# Patient Record
Sex: Female | Born: 1955 | Race: White | Hispanic: No | Smoking: Never smoker
Health system: Southern US, Community
[De-identification: ages and names within clinical notes are randomized; demographics above are authoritative.]

## PROBLEM LIST (undated history)

## (undated) DIAGNOSIS — F419 Anxiety disorder, unspecified: Secondary | ICD-10-CM

## (undated) DIAGNOSIS — M2669 Other specified disorders of temporomandibular joint: Secondary | ICD-10-CM

## (undated) DIAGNOSIS — F329 Major depressive disorder, single episode, unspecified: Secondary | ICD-10-CM

## (undated) DIAGNOSIS — M26649 Arthritis of unspecified temporomandibular joint: Secondary | ICD-10-CM

## (undated) DIAGNOSIS — F32A Depression, unspecified: Secondary | ICD-10-CM

## (undated) DIAGNOSIS — K829 Disease of gallbladder, unspecified: Secondary | ICD-10-CM

## (undated) DIAGNOSIS — E039 Hypothyroidism, unspecified: Secondary | ICD-10-CM

## (undated) DIAGNOSIS — R519 Headache, unspecified: Secondary | ICD-10-CM

## (undated) DIAGNOSIS — D693 Immune thrombocytopenic purpura: Secondary | ICD-10-CM

## (undated) DIAGNOSIS — E079 Disorder of thyroid, unspecified: Secondary | ICD-10-CM

## (undated) DIAGNOSIS — G248 Other dystonia: Secondary | ICD-10-CM

## (undated) DIAGNOSIS — Z8669 Personal history of other diseases of the nervous system and sense organs: Secondary | ICD-10-CM

## (undated) DIAGNOSIS — K279 Peptic ulcer, site unspecified, unspecified as acute or chronic, without hemorrhage or perforation: Secondary | ICD-10-CM

## (undated) DIAGNOSIS — M810 Age-related osteoporosis without current pathological fracture: Secondary | ICD-10-CM

## (undated) DIAGNOSIS — R51 Headache: Secondary | ICD-10-CM

## (undated) DIAGNOSIS — E7800 Pure hypercholesterolemia, unspecified: Secondary | ICD-10-CM

## (undated) DIAGNOSIS — S060X9A Concussion with loss of consciousness of unspecified duration, initial encounter: Secondary | ICD-10-CM

## (undated) DIAGNOSIS — G809 Cerebral palsy, unspecified: Secondary | ICD-10-CM

## (undated) HISTORY — DX: Age-related osteoporosis without current pathological fracture: M81.0

## (undated) HISTORY — PX: TMJ ARTHROPLASTY: SHX1066

## (undated) HISTORY — PX: TUBAL LIGATION: SHX77

## (undated) HISTORY — PX: HEMORROIDECTOMY: SUR656

---
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
## 1982-01-25 HISTORY — PX: CARPAL TUNNEL RELEASE: SHX101

## 2005-06-17 ENCOUNTER — Ambulatory Visit: Payer: Self-pay | Admitting: Family Medicine

## 2006-01-25 DIAGNOSIS — K829 Disease of gallbladder, unspecified: Secondary | ICD-10-CM

## 2006-01-25 DIAGNOSIS — K279 Peptic ulcer, site unspecified, unspecified as acute or chronic, without hemorrhage or perforation: Secondary | ICD-10-CM

## 2006-01-25 HISTORY — DX: Peptic ulcer, site unspecified, unspecified as acute or chronic, without hemorrhage or perforation: K27.9

## 2006-01-25 HISTORY — PX: CHOLECYSTECTOMY: SHX55

## 2006-01-25 HISTORY — DX: Disease of gallbladder, unspecified: K82.9

## 2008-03-04 ENCOUNTER — Ambulatory Visit: Payer: Self-pay | Admitting: Family Medicine

## 2008-03-13 ENCOUNTER — Ambulatory Visit: Payer: Self-pay | Admitting: Family Medicine

## 2008-09-10 ENCOUNTER — Ambulatory Visit: Payer: Self-pay | Admitting: Family Medicine

## 2011-09-07 DIAGNOSIS — G8929 Other chronic pain: Secondary | ICD-10-CM | POA: Insufficient documentation

## 2011-12-26 HISTORY — PX: CATARACT EXTRACTION W/ INTRAOCULAR LENS IMPLANT: SHX1309

## 2012-01-17 ENCOUNTER — Ambulatory Visit: Payer: Self-pay | Admitting: Ophthalmology

## 2012-07-11 DIAGNOSIS — E7800 Pure hypercholesterolemia, unspecified: Secondary | ICD-10-CM | POA: Insufficient documentation

## 2012-10-19 DIAGNOSIS — M542 Cervicalgia: Secondary | ICD-10-CM | POA: Insufficient documentation

## 2012-10-19 DIAGNOSIS — Z79899 Other long term (current) drug therapy: Secondary | ICD-10-CM | POA: Insufficient documentation

## 2012-11-09 DIAGNOSIS — Z8669 Personal history of other diseases of the nervous system and sense organs: Secondary | ICD-10-CM | POA: Insufficient documentation

## 2012-11-09 DIAGNOSIS — G248 Other dystonia: Secondary | ICD-10-CM | POA: Insufficient documentation

## 2013-08-10 DIAGNOSIS — E559 Vitamin D deficiency, unspecified: Secondary | ICD-10-CM | POA: Insufficient documentation

## 2014-10-06 ENCOUNTER — Encounter: Payer: Self-pay | Admitting: Emergency Medicine

## 2014-10-06 ENCOUNTER — Ambulatory Visit
Admission: EM | Admit: 2014-10-06 | Discharge: 2014-10-06 | Disposition: A | Discharge status: Home / Self Care | Payer: Federal, State, Local not specified - PPO | Attending: Family Medicine | Admitting: Family Medicine

## 2014-10-06 DIAGNOSIS — J300 Vasomotor rhinitis: Secondary | ICD-10-CM

## 2014-10-06 DIAGNOSIS — R42 Dizziness and giddiness: Secondary | ICD-10-CM

## 2014-10-06 DIAGNOSIS — H6123 Impacted cerumen, bilateral: Secondary | ICD-10-CM | POA: Diagnosis not present

## 2014-10-06 DIAGNOSIS — H6593 Unspecified nonsuppurative otitis media, bilateral: Secondary | ICD-10-CM

## 2014-10-06 HISTORY — DX: Disorder of thyroid, unspecified: E07.9

## 2014-10-06 MED ORDER — SALINE SPRAY 0.65 % NA SOLN: 2.0000 | NASAL | Status: DC

## 2014-10-06 MED ORDER — FLUTICASONE PROPIONATE 50 MCG/ACT NA SUSP: 1.0000 | Freq: Two times a day (BID) | NASAL | Status: DC

## 2014-10-06 MED ORDER — MECLIZINE HCL 25 MG PO TABS: 25.0000 mg | ORAL_TABLET | Freq: Three times a day (TID) | ORAL | Status: DC | PRN

## 2014-10-06 NOTE — ED Notes (Signed)
Patient presents here with c/o lt ear ache associted with dizziness - on and off for about 3 weeks now, states that t all started with the recent air travel, denies any fever

## 2014-10-06 NOTE — Discharge Instructions (Signed)
Cerumen Impaction A cerumen impaction is when the wax in your ear forms a plug. This plug usually causes reduced hearing. Sometimes it also causes an earache or dizziness. Removing a cerumen impaction can be difficult and painful. The wax sticks to the ear canal. The canal is sensitive and bleeds easily. If you try to remove a heavy wax buildup with a cotton tipped swab, you may push it in further. Irrigation with water, suction, and small ear curettes may be used to clear out the wax. If the impaction is fixed to the skin in the ear canal, ear drops may be needed for a few days to loosen the wax. People who build up a lot of wax frequently can use ear wax removal products available in your local drugstore. SEEK MEDICAL CARE IF:  You develop an earache, increased hearing loss, or marked dizziness. Document Released: 02/19/2004 Document Revised: 04/05/2011 Document Reviewed: 04/10/2009 [facility] Patient Information 2015 [place], [place]. This information is not intended to replace advice given to you by your health care provider. Make sure you discuss any questions you have with your health care provider.  Dizziness Dizziness is a common problem. It is a feeling of unsteadiness or light-headedness. You may feel like you are about to faint. Dizziness can lead to injury if you stumble or fall. A person of any age group can suffer from dizziness, but dizziness is more common in older adults. CAUSES  Dizziness can be caused by many different things, including:  Middle ear problems.  Standing for too long.  Infections.  An allergic reaction.  Aging.  An emotional response to something, such as the sight of blood.  Side effects of medicines.  Tiredness.  Problems with circulation or blood pressure.  Excessive use of alcohol or medicines, or illegal drug use.  Breathing too fast (hyperventilation).  An irregular heart rhythm (arrhythmia).  A low red blood cell count  (anemia).  Pregnancy.  Vomiting, diarrhea, fever, or other illnesses that cause body fluid loss (dehydration).  Diseases or conditions such as Parkinson's disease, high blood pressure (hypertension), diabetes, and thyroid problems.  Exposure to extreme heat. DIAGNOSIS  Your health care provider will ask about your symptoms, perform a physical exam, and perform an electrocardiogram (ECG) to record the electrical activity of your heart. Your health care provider may also perform other heart or blood tests to determine the cause of your dizziness. These may include:  Transthoracic echocardiogram (TTE). During echocardiography, sound waves are used to evaluate how blood flows through your heart.  Transesophageal echocardiogram (TEE).  Cardiac monitoring. This allows your health care provider to monitor your heart rate and rhythm in real time.  Holter monitor. This is a portable device that records your heartbeat and can help diagnose heart arrhythmias. It allows your health care provider to track your heart activity for several days if needed.  Stress tests by exercise or by giving medicine that makes the heart beat faster. TREATMENT  Treatment of dizziness depends on the cause of your symptoms and can vary greatly. HOME CARE INSTRUCTIONS   Drink enough fluids to keep your urine clear or pale yellow. This is especially important in very hot weather. In older adults, it is also important in cold weather.  Take your medicine exactly as directed if your dizziness is caused by medicines. When taking blood pressure medicines, it is especially important to get up slowly.  Rise slowly from chairs and steady yourself until you feel okay.  In the morning, first sit  up on the side of the bed. When you feel okay, stand slowly while holding onto something until you know your balance is fine.  Move your legs often if you need to stand in one place for a long time. Tighten and relax your muscles in  your legs while standing.  Have someone stay with you for 1-2 days if dizziness continues to be a problem. Do this until you feel you are well enough to stay alone. Have the person call your health care provider if he or she notices changes in you that are concerning.  Do not drive or use heavy machinery if you feel dizzy.  Do not drink alcohol. SEEK IMMEDIATE MEDICAL CARE IF:   Your dizziness or light-headedness gets worse.  You feel nauseous or vomit.  You have problems talking, walking, or using your arms, hands, or legs.  You feel weak.  You are not thinking clearly or you have trouble forming sentences. It may take a friend or family member to notice this.  You have chest pain, abdominal pain, shortness of breath, or sweating.  Your vision changes.  You notice any bleeding.  You have side effects from medicine that seems to be getting worse rather than better. MAKE SURE YOU:   Understand these instructions.  Will watch your condition.  Will get help right away if you are not doing well or get worse. Document Released: 07/07/2000 Document Revised: 01/16/2013 Document Reviewed: 07/31/2010 [facility] Patient Information 2015 [place], [place]. This information is not intended to replace advice given to you by your health care provider. Make sure you discuss any questions you have with your health care provider. Otitis Media With Effusion Otitis media with effusion is the presence of fluid in the middle ear. This is a common problem in children, which often follows ear infections. It may be present for weeks or longer after the infection. Unlike an acute ear infection, otitis media with effusion refers only to fluid behind the ear drum and not infection. Children with repeated ear and sinus infections and allergy problems are the most likely to get otitis media with effusion. CAUSES  The most frequent cause of the fluid buildup is dysfunction of the eustachian tubes. These are the  tubes that drain fluid in the ears to the back of the nose (nasopharynx). SYMPTOMS   The main symptom of this condition is hearing loss. As a result, you or your child may:  Listen to the TV at a loud volume.  Not respond to questions.  Ask "what" often when spoken to.  Mistake or confuse one sound or word for another.  There may be a sensation of fullness or pressure but usually not pain. DIAGNOSIS   Your health care provider will diagnose this condition by examining you or your child's ears.  Your health care provider may test the pressure in you or your child's ear with a tympanometer.  A hearing test may be conducted if the problem persists. TREATMENT   Treatment depends on the duration and the effects of the effusion.  Antibiotics, decongestants, nose drops, and cortisone-type drugs (tablets or nasal spray) may not be helpful.  Children with persistent ear effusions may have delayed language or behavioral problems. Children at risk for developmental delays in hearing, learning, and speech may require referral to a specialist earlier than children not at risk.  You or your child's health care provider may suggest a referral to an ear, nose, and throat surgeon for treatment. The following may  help restore normal hearing:  Drainage of fluid.  Placement of ear tubes (tympanostomy tubes).  Removal of adenoids (adenoidectomy). HOME CARE INSTRUCTIONS   Avoid secondhand smoke.  Infants who are breastfed are less likely to have this condition.  Avoid feeding infants while they are lying flat.  Avoid known environmental allergens.  Avoid people who are sick. SEEK MEDICAL CARE IF:   Hearing is not better in 3 months.  Hearing is worse.  Ear pain.  Drainage from the ear.  Dizziness. MAKE SURE YOU:   Understand these instructions.  Will watch your condition.  Will get help right away if you are not doing well or get worse. Document Released: 02/19/2004  Document Revised: 05/28/2013 Document Reviewed: 08/08/2012 [facility] Patient Information 2015 [place], [place]. This information is not intended to replace advice given to you by your health care provider. Make sure you discuss any questions you have with your health care provider. Allergic Rhinitis Allergic rhinitis is when the mucous membranes in the nose respond to allergens. Allergens are particles in the air that cause your body to have an allergic reaction. This causes you to release allergic antibodies. Through a chain of events, these eventually cause you to release histamine into the blood stream. Although meant to protect the body, it is this release of histamine that causes your discomfort, such as frequent sneezing, congestion, and an itchy, runny nose.  CAUSES  Seasonal allergic rhinitis (hay fever) is caused by pollen allergens that may come from grasses, trees, and weeds. Year-round allergic rhinitis (perennial allergic rhinitis) is caused by allergens such as house dust mites, pet dander, and mold spores.  SYMPTOMS   Nasal stuffiness (congestion).  Itchy, runny nose with sneezing and tearing of the eyes. DIAGNOSIS  Your health care provider can help you determine the allergen or allergens that trigger your symptoms. If you and your health care provider are unable to determine the allergen, skin or blood testing may be used. TREATMENT  Allergic rhinitis does not have a cure, but it can be controlled by:  Medicines and allergy shots (immunotherapy).  Avoiding the allergen. Hay fever may often be treated with antihistamines in pill or nasal spray forms. Antihistamines block the effects of histamine. There are over-the-counter medicines that may help with nasal congestion and swelling around the eyes. Check with your health care provider before taking or giving this medicine.  If avoiding the allergen or the medicine prescribed do not work, there are many new medicines your health care  provider can prescribe. Stronger medicine may be used if initial measures are ineffective. Desensitizing injections can be used if medicine and avoidance does not work. Desensitization is when a patient is given ongoing shots until the body becomes less sensitive to the allergen. Make sure you follow up with your health care provider if problems continue. HOME CARE INSTRUCTIONS It is not possible to completely avoid allergens, but you can reduce your symptoms by taking steps to limit your exposure to them. It helps to know exactly what you are allergic to so that you can avoid your specific triggers. SEEK MEDICAL CARE IF:   You have a fever.  You develop a cough that does not stop easily (persistent).  You have shortness of breath.  You start wheezing.  Symptoms interfere with normal daily activities. Document Released: 10/06/2000 Document Revised: 01/16/2013 Document Reviewed: 09/18/2012 [facility] Patient Information 2015 [place], [place]. This information is not intended to replace advice given to you by your health care provider. Make sure  you discuss any questions you have with your health care provider.

## 2014-10-06 NOTE — ED Provider Notes (Signed)
CSN: [id number]     Arrival date & time 10/06/14  1330 History   First MD Initiated Contact with Patient 10/06/14 1413     Chief Complaint  Patient presents with  . Otalgia  . Dizziness   (Consider location/radiation/quality/duration/timing/severity/associated sxs/prior Treatment) HPI Comments: Single caucasian female here for evaluation of left ear pain and dizziness after travel to [place] via airplane; ear pain and dizziness intermittent x 3 weeks.  Denied ear discharge.  Feels like she has water in her ears.  Tried peroxide without any relief in external canal.  Psychiatric RN doesn't require work note  The history is provided by the patient.    Past Medical History  Diagnosis Date  . Thyroid disease    Past Surgical History  Procedure Laterality Date  . Tmj arthroplasty     History reviewed. No pertinent family history. Social History  Substance Use Topics  . Smoking status: Never Smoker   . Smokeless tobacco: None  . Alcohol Use: No   OB History    No data available     Review of Systems  Constitutional: Negative for fever, chills, diaphoresis, activity change, appetite change, fatigue and unexpected weight change.  HENT: Positive for ear pain. Negative for congestion, dental problem, drooling, ear discharge, facial swelling, hearing loss, mouth sores, nosebleeds, postnasal drip, rhinorrhea, sinus pressure, sneezing, sore throat, tinnitus, trouble swallowing and voice change.   Respiratory: Negative for cough, choking, shortness of breath, wheezing and stridor.   Cardiovascular: Negative for chest pain, palpitations and leg swelling.  Gastrointestinal: Negative for nausea, vomiting, abdominal pain, diarrhea, constipation, abdominal distention and rectal pain.  Genitourinary: Negative for dysuria.  Musculoskeletal: Negative for myalgias, back pain, joint swelling, arthralgias, gait problem, neck pain and neck stiffness.  Skin: Negative for color change, pallor, rash  and wound.  Allergic/Immunologic: Positive for environmental allergies. Negative for food allergies.  Neurological: Positive for dizziness. Negative for tremors, seizures, syncope, facial asymmetry, speech difficulty, weakness, light-headedness, numbness and headaches.  Hematological: Negative for adenopathy. Does not bruise/bleed easily.  Psychiatric/Behavioral: Negative for behavioral problems, confusion, sleep disturbance and agitation.    Allergies  Codeine  Home Medications   Prior to Admission medications   Medication Sig Start Date End Date Taking? Authorizing Provider  levothyroxine (SYNTHROID, LEVOTHROID) 125 MCG tablet Take 125 mcg by mouth daily before breakfast.   Yes Historical Provider, MD  sertraline (ZOLOFT) 100 MG tablet Take 150 mg by mouth daily.   Yes Historical Provider, MD  traMADol (ULTRAM) 50 MG tablet Take by mouth every 6 (six) hours as needed.   Yes Historical Provider, MD  fluticasone (FLONASE) 50 MCG/ACT nasal spray Place 1 spray into both nostrils 2 (two) times daily. 10/06/14   Barbaraann Barthel, NP  meclizine (ANTIVERT) 25 MG tablet Take 1 tablet (25 mg total) by mouth 3 (three) times daily as needed for dizziness (max 100mg  per 24 hours). 10/06/14   Barbaraann Barthel, NP  sodium chloride (OCEAN) 0.65 % SOLN nasal spray Place 2 sprays into both nostrils every 2 (two) hours while awake. 10/06/14   Barbaraann Barthel, NP   Meds Ordered and Administered this Visit  Medications - No data to display  BP 158/59 mmHg  Pulse 76  Temp(Src) 97.7 F (36.5 C) (Tympanic)  Resp 18  Ht 5\' 3"  (1.6 m)  Wt 145 lb (65.772 kg)  BMI 25.69 kg/m2  SpO2 98% No data found.   Physical Exam  Constitutional: She is oriented to person, place, and time.  Vital signs are normal. She appears well-developed and well-nourished. No distress.  HENT:  Head: Normocephalic and atraumatic.  Right Ear: Hearing, external ear and ear canal normal. A middle ear effusion is present.  Left Ear:  Hearing, external ear and ear canal normal. A middle ear effusion is present.  Nose: Nose normal. No mucosal edema, rhinorrhea, nose lacerations, sinus tenderness, nasal deformity, septal deviation or nasal septal hematoma. No epistaxis.  No foreign bodies. Right sinus exhibits no maxillary sinus tenderness and no frontal sinus tenderness. Left sinus exhibits no maxillary sinus tenderness and no frontal sinus tenderness.  Mouth/Throat: Uvula is midline and mucous membranes are normal. Mucous membranes are not pale and not dry. She does not have dentures. No oral lesions. No trismus in the jaw. Normal dentition. No dental abscesses, uvula swelling, lacerations or dental caries. Posterior oropharyngeal edema and posterior oropharyngeal erythema present. No oropharyngeal exudate or tonsillar abscesses.  Cobblestoning posterior pharynx; edema/erythema bilateral turbinates with clear discharge; bilateral TMs with air fluid level slight opacity; cerumen soft yellow bilateral external canals unable to initially visualize TMs curretage use to remove cerumen incomplete left; saline irrigation by RN cleared all cerumen slight erythema canal drop of blood where cerumen had been adherent patient reported hearing better, some relief ache but not sore  Eyes: Conjunctivae, EOM and lids are normal. Pupils are equal, round, and reactive to light. Right eye exhibits no discharge. Left eye exhibits no discharge. No scleral icterus.  Neck: Trachea normal and normal range of motion. Neck supple. No tracheal deviation present. No thyromegaly present.  Cardiovascular: Normal rate, regular rhythm, normal heart sounds and intact distal pulses.   Pulmonary/Chest: Effort normal and breath sounds normal. No stridor. No respiratory distress. She has no wheezes. She has no rales. She exhibits no tenderness.  Abdominal: Soft. She exhibits no distension.  Musculoskeletal: Normal range of motion. She exhibits no edema or tenderness.   Lymphadenopathy:    She has no cervical adenopathy.  Neurological: She is alert and oriented to person, place, and time. She exhibits normal muscle tone. Coordination normal.  Skin: Skin is warm, dry and intact. No rash noted. She is not diaphoretic. No erythema. No pallor.  Psychiatric: She has a normal mood and affect. Her speech is normal and behavior is normal. Judgment and thought content normal. Cognition and memory are normal.  Nursing note and vitals reviewed.   ED Course  Procedures (including critical care time)  Labs Review Labs Reviewed - No data to display  Imaging Review No results found.  1400 attempted cerumen removal bilateral external canals with currettage.  Partial clearing left and complete clearing right.  Ordered ear irrigation by RN for left ear. Patient refused trial of meclizine in clinic will pick up at pharmacy and try at home and start flonase and nasal saline.   Patient verbalized understanding of information/instructions, agreed with plan of care.  1445 re-evaluation left external canal after saline irrigation by RN Kreg Shropshire.  Complete removal of cerumen.  Obtained hard yellow wax from left ear with irrigation.  Patient reported improved hearing at conclusion of procedure.  Spot blood in external canal where cerumen was adhered, slight discomfort and erythema noted external left ear canal.  Air fluid level noted slight opacity left TM.  MDM   1. Otitis media with effusion, bilateral   2. Cerumen impaction, bilateral   3. Dizziness   4. Vasomotor rhinitis    Supportive treatment.   No evidence of invasive bacterial infection, non toxic  and well hydrated.  This is most likely self limiting viral infection.  I do not see where any further testing or imaging is necessary at this time.   I will suggest supportive care, rest, good hygiene and encourage the patient to take adequate fluids.  The patient is to return to clinic or EMERGENCY ROOM if  symptoms worsen or change significantly e.g. ear pain, fever, purulent discharge from ears or bleeding.  Exitcare handout on otitis media with effusion given to patient.  Patient verbalized agreement and understanding of treatment plan.    Curettage performed by provider bilaterally complete clearing right and incomplete clearing left cerumen; requested cerumen irrigation by RN Kreg Shropshire and complete clearing of earwax obtained by nurse for left external canal utilizing syringe method.  Patient reported slight discomfort external ear canal after procedure, minor redness noted bilaterally where wax had been adherent and TMs intact without erythema.  Patient reported sounds are louder now.  Discussed purpose of earwax with patient.  Avoid cotton applicator (Q-tip) use in ears.  Patient verbalized understanding, agreed with plan of care and had no further questions at this time.   Patient may use normal saline nasal spray as needed.  Start flonase 1 spray each nostril BID.  Consider antihistamine.  Meclizine Rx in case dizziness continues resolved in clinic after cerumen removal.  Discussed with patient fluid in ears can sometimes cause dizziness.   Avoid triggers if possible.  Shower prior to bedtime if exposed to triggers.  If allergic dust/dust mites recommend mattress/pillow covers/encasements; washing linens, vacuuming, sweeping, dusting weekly.  Call or return to clinic as needed if these symptoms worsen or fail to improve as anticipated.   Exitcare handout on allergic rhinitis given to patient.  Patient verbalized understanding of instructions, agreed with plan of care and had no further questions at this time.  P2:  Avoidance and hand washing.       Barbaraann Barthel, NP 10/07/14 2034

## 2014-12-10 DIAGNOSIS — N952 Postmenopausal atrophic vaginitis: Secondary | ICD-10-CM | POA: Insufficient documentation

## 2015-07-02 DIAGNOSIS — G248 Other dystonia: Secondary | ICD-10-CM | POA: Insufficient documentation

## 2015-11-24 ENCOUNTER — Other Ambulatory Visit: Payer: Self-pay | Admitting: Medical Oncology

## 2015-11-24 DIAGNOSIS — R1030 Lower abdominal pain, unspecified: Secondary | ICD-10-CM

## 2015-11-25 ENCOUNTER — Other Ambulatory Visit: Payer: Self-pay | Admitting: Medical Oncology

## 2015-11-25 DIAGNOSIS — R102 Pelvic and perineal pain: Secondary | ICD-10-CM

## 2015-12-01 ENCOUNTER — Ambulatory Visit
Admission: RE | Admit: 2015-12-01 | Discharge: 2015-12-01 | Disposition: A | Discharge status: Home / Self Care | Payer: Federal, State, Local not specified - PPO | Source: Ambulatory Visit | Attending: Medical Oncology | Admitting: Medical Oncology

## 2015-12-01 DIAGNOSIS — R109 Unspecified abdominal pain: Secondary | ICD-10-CM | POA: Diagnosis not present

## 2015-12-01 DIAGNOSIS — R1030 Lower abdominal pain, unspecified: Secondary | ICD-10-CM

## 2015-12-01 DIAGNOSIS — R102 Pelvic and perineal pain: Secondary | ICD-10-CM

## 2016-04-21 DIAGNOSIS — G44229 Chronic tension-type headache, not intractable: Secondary | ICD-10-CM | POA: Insufficient documentation

## 2016-04-21 DIAGNOSIS — Z5181 Encounter for therapeutic drug level monitoring: Secondary | ICD-10-CM | POA: Insufficient documentation

## 2016-04-21 DIAGNOSIS — Z79891 Long term (current) use of opiate analgesic: Secondary | ICD-10-CM | POA: Insufficient documentation

## 2016-06-04 ENCOUNTER — Ambulatory Visit
Admission: EM | Admit: 2016-06-04 | Discharge: 2016-06-04 | Disposition: A | Discharge status: Home / Self Care | Payer: Federal, State, Local not specified - PPO | Attending: Family Medicine | Admitting: Family Medicine

## 2016-06-04 DIAGNOSIS — J069 Acute upper respiratory infection, unspecified: Secondary | ICD-10-CM

## 2016-06-04 DIAGNOSIS — J9801 Acute bronchospasm: Secondary | ICD-10-CM

## 2016-06-04 MED ORDER — BENZONATATE 100 MG PO CAPS: 100.0000 mg | ORAL_CAPSULE | Freq: Three times a day (TID) | ORAL | 0 refills | Status: DC | PRN

## 2016-06-04 MED ORDER — DOXYCYCLINE HYCLATE 100 MG PO CAPS: 100.0000 mg | ORAL_CAPSULE | Freq: Two times a day (BID) | ORAL | 0 refills | Status: DC

## 2016-06-04 MED ORDER — PREDNISONE 20 MG PO TABS: 40.0000 mg | ORAL_TABLET | Freq: Every day | ORAL | 0 refills | Status: AC

## 2016-06-04 MED ORDER — OPTICHAMBER DIAMOND MISC
1.0000 | Freq: Once | Status: AC
  Administered 2016-06-04: 1

## 2016-06-04 MED ORDER — ALBUTEROL SULFATE HFA 108 (90 BASE) MCG/ACT IN AERS: 2.0000 | INHALATION_SPRAY | RESPIRATORY_TRACT | 0 refills | Status: DC | PRN

## 2016-06-04 NOTE — ED Triage Notes (Signed)
Patient complains of cough, sore throat, hoarseness. Patient states that she has a daughter graduating from college this weekend and has family members in town that immunocompromised. Patient states that she wants to make sure she doesn't pass this on to anyone else.

## 2016-06-04 NOTE — ED Provider Notes (Signed)
[facility] ____________________________________________  Time seen: Approximately 2:37 PM  I have reviewed the triage vital signs and the nursing notes.   HISTORY  Chief Complaint Cough   HPI Helen Nguyen is a 61 y.o. female presenting for evaluation of 6 days of cough and congestion. Patient states that she feels that she has coughing fits with associated wheezing. Denies any persistent wheezing. Denies chest pain or shortness of breath. States some sore throat over the last few days. Reports some nasal congestion, denies sinus pain or sinus pressure. Reports does cough up greenish phlegm. Reports her daughter visiting her and had similar just prior to her symptom onset. Denies any fevers. Reports continues to eat and drink well. Reports continues to remain active. Patient states that she has a history of similar presentation with bronchitis. Reports symptoms unresolved with multiple over-the-counter cough and congestion medications and vitamin C.  Denies chest pain, shortness of breath, abdominal pain, dysuria, extremity pain, extremity swelling or rash. Denies recent sickness. Denies recent antibiotic use.    Past Medical History:  Diagnosis Date  . Thyroid disease     There are no active problems to display for this patient.   Past Surgical History:  Procedure Laterality Date  . TMJ ARTHROPLASTY       No current facility-administered medications for this encounter.   Current Outpatient Prescriptions:  .  levothyroxine (SYNTHROID, LEVOTHROID) 125 MCG tablet, Take 125 mcg by mouth daily before breakfast., Disp: , Rfl:  .  sertraline (ZOLOFT) 100 MG tablet, Take 150 mg by mouth daily., Disp: , Rfl:  .  traMADol (ULTRAM) 50 MG tablet, Take by mouth every 6 (six) hours as needed., Disp: , Rfl:  .  albuterol (PROVENTIL HFA;VENTOLIN HFA) 108 (90 Base) MCG/ACT inhaler, Inhale 2 puffs into the lungs every 4 (four) hours as needed for wheezing., Disp: 1 Inhaler,  Rfl: 0 .  benzonatate (TESSALON PERLES) 100 MG capsule, Take 1 capsule (100 mg total) by mouth 3 (three) times daily as needed for cough., Disp: 15 capsule, Rfl: 0 .  doxycycline (VIBRAMYCIN) 100 MG capsule, Take 1 capsule (100 mg total) by mouth 2 (two) times daily., Disp: 20 capsule, Rfl: 0 .  fluticasone (FLONASE) 50 MCG/ACT nasal spray, Place 1 spray into both nostrils 2 (two) times daily., Disp: 16 g, Rfl: 0 .  meclizine (ANTIVERT) 25 MG tablet, Take 1 tablet (25 mg total) by mouth 3 (three) times daily as needed for dizziness (max 100mg  per 24 hours)., Disp: 30 tablet, Rfl: 0 .  predniSONE (DELTASONE) 20 MG tablet, Take 2 tablets (40 mg total) by mouth daily., Disp: 10 tablet, Rfl: 0 .  sodium chloride (OCEAN) 0.65 % SOLN nasal spray, Place 2 sprays into both nostrils every 2 (two) hours while awake., Disp: , Rfl: 0  Allergies Codeine  History reviewed. No pertinent family history.  Social History Social History  Substance Use Topics  . Smoking status: Never Smoker  . Smokeless tobacco: Never Used  . Alcohol use Yes     Comment: occasionally    Review of Systems Constitutional: No fever/chills ENT: Positive sore throat. Cardiovascular: Denies chest pain. Respiratory: Denies shortness of breath. Gastrointestinal: No abdominal pain.  No nausea, no vomiting.  No diarrhea.  No constipation. Genitourinary: Negative for dysuria. Musculoskeletal: Negative for back pain. Skin: Negative for rash.   ____________________________________________   PHYSICAL EXAM:  VITAL SIGNS: ED Triage Vitals  Enc Vitals Group     BP 06/04/16 1412 (!) 150/84  Pulse Rate 06/04/16 1412 77     Resp 06/04/16 1412 16     Temp 06/04/16 1412 98.9 F (37.2 C)     Temp Source 06/04/16 1412 Oral     SpO2 06/04/16 1412 96 %     Weight 06/04/16 1410 157 lb (71.2 kg)     Height 06/04/16 1410 5\' 3"  (1.6 m)     Head Circumference --      Peak Flow --      Pain Score 06/04/16 1410 5     Pain Loc --       Pain Edu? --      Excl. in GC? --     Constitutional: Alert and oriented. Well appearing and in no acute distress. Eyes: Conjunctivae are normal. PERRL. EOMI. Head: Atraumatic. No sinus tenderness to palpation. No swelling. No erythema.  Ears: no erythema, normal TMs bilaterally.   Nose:Nasal congestion with clear rhinorrhea  Mouth/Throat: Mucous membranes are moist. Mild pharyngeal erythema. No tonsillar swelling or exudate.  Neck: No stridor.  No cervical spine tenderness to palpation. Hematological/Lymphatic/Immunilogical: No cervical lymphadenopathy. Cardiovascular: Normal rate, regular rhythm. Grossly normal heart sounds.  Good peripheral circulation. Respiratory: Normal respiratory effort.  No retractions. No wheezes, rales or rhonchi. Good air movement. Dry intermittent cough noted in room with bronchospasm. Speaks in complete sentences. Musculoskeletal: Ambulatory with steady gait. No cervical, thoracic or lumbar tenderness to palpation. Neurologic:  Normal speech and language. No gait instability. Skin:  Skin appears warm, dry Psychiatric: Mood and affect are normal. Speech and behavior are normal. ___________________________________________   LABS (all labs ordered are listed, but only abnormal results are displayed)  Labs Reviewed - No data to display  PROCEDURES Procedures    INITIAL IMPRESSION / ASSESSMENT AND PLAN / ED COURSE  Pertinent labs & imaging results that were available during my care of the patient were reviewed by me and considered in my medical decision making (see chart for details).  Well-appearing patient. No acute distress. Suspect viral upper respiratory infection with associated bronchospasm. Patient declines x-ray at this time. Patient does report mild sore throat, but states mostly from coughing, discussed with patient evaluation of strep swab, patient declined strep swab at this time. Discussed the patient suspect viral and will treat patient  supportively with oral prednisone, when necessary albuterol inhaler and Tessalon Perles. Discussed with patient suspect viral, patient agreed. Also discussed with patient the symptom persists, Rx given for oral doxycycline to initiate another 3 days if symptoms not improving. Discussed strict follow-up and return parameters for any worsening concerns or change in status.Discussed indication, risks and benefits of medications with patient.  Discussed follow up with Primary care physician this week. Discussed follow up and return parameters including no resolution or any worsening concerns. Patient verbalized understanding and agreed to plan.   ____________________________________________   FINAL CLINICAL IMPRESSION(S) / ED DIAGNOSES  Final diagnoses:  URI with cough and congestion  Bronchospasm     Discharge Medication List as of 06/04/2016  2:50 PM    START taking these medications   Details  albuterol (PROVENTIL HFA;VENTOLIN HFA) 108 (90 Base) MCG/ACT inhaler Inhale 2 puffs into the lungs every 4 (four) hours as needed for wheezing., Starting Fri 06/04/2016, Normal    benzonatate (TESSALON PERLES) 100 MG capsule Take 1 capsule (100 mg total) by mouth 3 (three) times daily as needed for cough., Starting Fri 06/04/2016, Normal    doxycycline (VIBRAMYCIN) 100 MG capsule Take 1 capsule (100 mg total) by mouth  2 (two) times daily., Starting Fri 06/04/2016, Print    predniSONE (DELTASONE) 20 MG tablet Take 2 tablets (40 mg total) by mouth daily., Starting Fri 06/04/2016, Normal        Note: This dictation was prepared with Dragon dictation along with smaller phrase technology. Any transcriptional errors that result from this process are unintentional.           Renford DillsMiller, Chrishauna Mee, NP 06/04/16 1630

## 2016-06-04 NOTE — Discharge Instructions (Signed)
Take medication as prescribed. Rest. Drink plenty of fluids.  ° °Follow up with your primary care physician this week as needed. Return to Urgent care for new or worsening concerns.  ° °

## 2017-01-05 DIAGNOSIS — G809 Cerebral palsy, unspecified: Secondary | ICD-10-CM | POA: Insufficient documentation

## 2017-01-12 DIAGNOSIS — M65311 Trigger thumb, right thumb: Secondary | ICD-10-CM | POA: Insufficient documentation

## 2017-04-18 ENCOUNTER — Ambulatory Visit
Admission: RE | Admit: 2017-04-18 | Discharge: 2017-04-18 | Disposition: A | Discharge status: Home / Self Care | Payer: Federal, State, Local not specified - PPO | Source: Ambulatory Visit | Attending: Family Medicine | Admitting: Family Medicine

## 2017-04-18 ENCOUNTER — Other Ambulatory Visit: Payer: Self-pay | Admitting: Family Medicine

## 2017-04-18 DIAGNOSIS — M5442 Lumbago with sciatica, left side: Secondary | ICD-10-CM | POA: Insufficient documentation

## 2017-04-18 DIAGNOSIS — M5136 Other intervertebral disc degeneration, lumbar region: Secondary | ICD-10-CM | POA: Insufficient documentation

## 2017-04-22 ENCOUNTER — Other Ambulatory Visit: Payer: Self-pay | Admitting: Orthopedic Surgery

## 2017-04-22 DIAGNOSIS — S32020D Wedge compression fracture of second lumbar vertebra, subsequent encounter for fracture with routine healing: Secondary | ICD-10-CM

## 2017-04-23 ENCOUNTER — Ambulatory Visit
Admission: RE | Admit: 2017-04-23 | Discharge: 2017-04-23 | Disposition: A | Discharge status: Home / Self Care | Payer: Federal, State, Local not specified - PPO | Source: Ambulatory Visit | Attending: Orthopedic Surgery | Admitting: Orthopedic Surgery

## 2017-04-23 DIAGNOSIS — R29890 Loss of height: Secondary | ICD-10-CM | POA: Insufficient documentation

## 2017-04-23 DIAGNOSIS — M5137 Other intervertebral disc degeneration, lumbosacral region: Secondary | ICD-10-CM | POA: Insufficient documentation

## 2017-04-23 DIAGNOSIS — M5136 Other intervertebral disc degeneration, lumbar region: Secondary | ICD-10-CM | POA: Diagnosis not present

## 2017-04-23 DIAGNOSIS — M48061 Spinal stenosis, lumbar region without neurogenic claudication: Secondary | ICD-10-CM | POA: Diagnosis not present

## 2017-04-23 DIAGNOSIS — S32020D Wedge compression fracture of second lumbar vertebra, subsequent encounter for fracture with routine healing: Secondary | ICD-10-CM | POA: Insufficient documentation

## 2017-04-23 DIAGNOSIS — X58XXXD Exposure to other specified factors, subsequent encounter: Secondary | ICD-10-CM | POA: Insufficient documentation

## 2017-04-26 ENCOUNTER — Inpatient Hospital Stay
Admission: RE | Admit: 2017-04-26 | Discharge: 2017-04-26 | Disposition: A | Discharge status: Home / Self Care | Payer: Federal, State, Local not specified - PPO | Source: Ambulatory Visit

## 2017-04-26 NOTE — Pre-Procedure Instructions (Signed)
[facility] PATIENT TO BE PUT ON SCHEDULE AS P.A.T VISIT.

## 2017-04-27 ENCOUNTER — Encounter
Admission: RE | Admit: 2017-04-27 | Discharge: 2017-04-27 | Disposition: A | Discharge status: Home / Self Care | Payer: Federal, State, Local not specified - PPO | Source: Ambulatory Visit | Attending: Orthopedic Surgery | Admitting: Orthopedic Surgery

## 2017-04-27 ENCOUNTER — Other Ambulatory Visit: Payer: Self-pay

## 2017-04-27 DIAGNOSIS — Z7952 Long term (current) use of systemic steroids: Secondary | ICD-10-CM | POA: Diagnosis not present

## 2017-04-27 DIAGNOSIS — G809 Cerebral palsy, unspecified: Secondary | ICD-10-CM | POA: Diagnosis not present

## 2017-04-27 DIAGNOSIS — M8008XA Age-related osteoporosis with current pathological fracture, vertebra(e), initial encounter for fracture: Secondary | ICD-10-CM | POA: Diagnosis not present

## 2017-04-27 DIAGNOSIS — F329 Major depressive disorder, single episode, unspecified: Secondary | ICD-10-CM | POA: Diagnosis not present

## 2017-04-27 DIAGNOSIS — F419 Anxiety disorder, unspecified: Secondary | ICD-10-CM | POA: Diagnosis not present

## 2017-04-27 DIAGNOSIS — Z791 Long term (current) use of non-steroidal anti-inflammatories (NSAID): Secondary | ICD-10-CM | POA: Diagnosis not present

## 2017-04-27 DIAGNOSIS — Z79899 Other long term (current) drug therapy: Secondary | ICD-10-CM | POA: Diagnosis not present

## 2017-04-27 DIAGNOSIS — Z79890 Hormone replacement therapy (postmenopausal): Secondary | ICD-10-CM | POA: Diagnosis not present

## 2017-04-27 DIAGNOSIS — E039 Hypothyroidism, unspecified: Secondary | ICD-10-CM | POA: Diagnosis not present

## 2017-04-27 HISTORY — DX: Cerebral palsy, unspecified: G80.9

## 2017-04-27 HISTORY — DX: Arthritis of unspecified temporomandibular joint: M26.649

## 2017-04-27 HISTORY — DX: Anxiety disorder, unspecified: F41.9

## 2017-04-27 HISTORY — DX: Headache: R51

## 2017-04-27 HISTORY — DX: Concussion with loss of consciousness of unspecified duration, initial encounter: S06.0X9A

## 2017-04-27 HISTORY — DX: Peptic ulcer, site unspecified, unspecified as acute or chronic, without hemorrhage or perforation: K27.9

## 2017-04-27 HISTORY — DX: Major depressive disorder, single episode, unspecified: F32.9

## 2017-04-27 HISTORY — DX: Other specified disorders of temporomandibular joint: M26.69

## 2017-04-27 HISTORY — DX: Hypothyroidism, unspecified: E03.9

## 2017-04-27 HISTORY — DX: Immune thrombocytopenic purpura: D69.3

## 2017-04-27 HISTORY — DX: Pure hypercholesterolemia, unspecified: E78.00

## 2017-04-27 HISTORY — DX: Headache, unspecified: R51.9

## 2017-04-27 HISTORY — DX: Personal history of other diseases of the nervous system and sense organs: Z86.69

## 2017-04-27 HISTORY — DX: Other dystonia: G24.8

## 2017-04-27 HISTORY — DX: Depression, unspecified: F32.A

## 2017-04-27 HISTORY — DX: Disease of gallbladder, unspecified: K82.9

## 2017-04-27 LAB — CBC
HEMATOCRIT: 40 % (ref 35.0–47.0)
Hemoglobin: 13.4 g/dL (ref 12.0–16.0)
MCH: 28.7 pg (ref 26.0–34.0)
MCHC: 33.4 g/dL (ref 32.0–36.0)
MCV: 85.8 fL (ref 80.0–100.0)
Platelets: 209 10*3/uL (ref 150–440)
RBC: 4.67 MIL/uL (ref 3.80–5.20)
RDW: 13.6 % (ref 11.5–14.5)
WBC: 9.8 10*3/uL (ref 3.6–11.0)

## 2017-04-27 LAB — BASIC METABOLIC PANEL
Anion gap: 7 (ref 5–15)
BUN: 18 mg/dL (ref 6–20)
CALCIUM: 9.6 mg/dL (ref 8.9–10.3)
CO2: 27 mmol/L (ref 22–32)
Chloride: 105 mmol/L (ref 101–111)
Creatinine, Ser: 0.74 mg/dL (ref 0.44–1.00)
GFR calc non Af Amer: >60 mL/min (ref >60)
Glucose, Bld: 107 mg/dL — ABNORMAL HIGH (ref 65–99)
Potassium: 4 mmol/L (ref 3.5–5.1)
SODIUM: 139 mmol/L (ref 135–145)

## 2017-04-27 LAB — SURGICAL PCR SCREEN
MRSA, PCR: NEGATIVE
STAPHYLOCOCCUS AUREUS: NEGATIVE

## 2017-04-27 MED ORDER — CEFAZOLIN SODIUM-DEXTROSE 2-4 GM/100ML-% IV SOLN
2.0000 g | Freq: Once | INTRAVENOUS | Status: AC
  Administered 2017-04-28: 2 g via INTRAVENOUS

## 2017-04-27 NOTE — Patient Instructions (Signed)
Your procedure is scheduled on:  Thursday, April 28, 2017 Report to Day Surgery on the 2nd floor of [facility] at 7:00 am To find out your arrival time, please call [phone] between 1PM - 3PM on:  REMEMBER: Instructions that are not followed completely may result in serious medical risk, up to and including death; or upon the discretion of your surgeon and anesthesiologist your surgery may need to be rescheduled.  Do not eat food after midnight the night before your procedure.  No gum chewing, lozengers or hard candies.  You may however, drink CLEAR liquids up to 2 hours before you are scheduled to arrive for your surgery. Do not drink anything within 2 hours of the start of your surgery.  Clear liquids include: - water  - apple juice without pulp - clear gatorade - black coffee or tea (Do NOT add anything to the coffee or tea) Do NOT drink anything that is not on this list.  No Alcohol for 24 hours before or after surgery.  No Smoking including e-cigarettes for 24 hours prior to surgery.  No chewable tobacco products for at least 6 hours prior to surgery.  No nicotine patches on the day of surgery.  On the morning of surgery brush your teeth with toothpaste and water, you may rinse your mouth with mouthwash if you wish. Do not swallow any toothpaste or mouthwash.  Notify your doctor if there is any change in your medical condition (cold, fever, infection).  Do not wear jewelry, make-up, hairpins, clips or nail polish.  Do not wear lotions, powders, or perfumes. You may wear deodorant.  Do not shave 48 hours prior to surgery.   Contacts and dentures may not be worn into surgery.  Do not bring valuables to the hospital, including drivers license, insurance or credit cards.  [facility] is not responsible for any belongings or valuables.   TAKE THESE MEDICATIONS THE MORNING OF SURGERY:  1.  LEVOTHYROXINE 2.  ZOLOFT 3.  TRAMADOL (if needed for pain)  Use CHG  Soap or wipes as directed on instruction sheet.  NOW!  Stop Anti-inflammatories (NSAIDS) such as Advil, Aleve, Ibuprofen, Motrin, Naproxen, Naprosyn and Aspirin based products such as Excedrin, Goodys Powder, BC Powder. (May take Tylenol or Acetaminophen if needed.)  NOW!  Stop ANY OVER THE COUNTER supplements until after surgery.  Wear comfortable clothing (specific to your surgery type) to the hospital.  Plan for stool softeners for home use.  If you are being discharged the day of surgery, you will not be allowed to drive home. You will need a responsible adult to drive you home and stay with you that night.   If you are taking public transportation, you will need to have a responsible adult with you. Please confirm with your physician that it is acceptable to use public transportation.   Please call [phone] if you have any questions about these instructions.

## 2017-04-28 ENCOUNTER — Ambulatory Visit: Payer: Federal, State, Local not specified - PPO | Admitting: Anesthesiology

## 2017-04-28 ENCOUNTER — Ambulatory Visit
Admission: RE | Admit: 2017-04-28 | Discharge: 2017-04-28 | Disposition: A | Discharge status: Home / Self Care | Payer: Federal, State, Local not specified - PPO | Source: Ambulatory Visit | Attending: Orthopedic Surgery | Admitting: Orthopedic Surgery

## 2017-04-28 ENCOUNTER — Encounter: Payer: Self-pay | Admitting: *Deleted

## 2017-04-28 ENCOUNTER — Ambulatory Visit: Payer: Federal, State, Local not specified - PPO

## 2017-04-28 ENCOUNTER — Other Ambulatory Visit: Payer: Self-pay

## 2017-04-28 ENCOUNTER — Encounter
Admission: RE | Disposition: A | Discharge status: Home / Self Care | Payer: Self-pay | Source: Ambulatory Visit | Attending: Orthopedic Surgery

## 2017-04-28 DIAGNOSIS — Z791 Long term (current) use of non-steroidal anti-inflammatories (NSAID): Secondary | ICD-10-CM | POA: Insufficient documentation

## 2017-04-28 DIAGNOSIS — F419 Anxiety disorder, unspecified: Secondary | ICD-10-CM | POA: Insufficient documentation

## 2017-04-28 DIAGNOSIS — Z79899 Other long term (current) drug therapy: Secondary | ICD-10-CM | POA: Insufficient documentation

## 2017-04-28 DIAGNOSIS — M8008XA Age-related osteoporosis with current pathological fracture, vertebra(e), initial encounter for fracture: Secondary | ICD-10-CM | POA: Diagnosis not present

## 2017-04-28 DIAGNOSIS — G809 Cerebral palsy, unspecified: Secondary | ICD-10-CM | POA: Insufficient documentation

## 2017-04-28 DIAGNOSIS — Z419 Encounter for procedure for purposes other than remedying health state, unspecified: Secondary | ICD-10-CM

## 2017-04-28 DIAGNOSIS — E039 Hypothyroidism, unspecified: Secondary | ICD-10-CM | POA: Insufficient documentation

## 2017-04-28 DIAGNOSIS — Z79890 Hormone replacement therapy (postmenopausal): Secondary | ICD-10-CM | POA: Insufficient documentation

## 2017-04-28 DIAGNOSIS — F329 Major depressive disorder, single episode, unspecified: Secondary | ICD-10-CM | POA: Insufficient documentation

## 2017-04-28 DIAGNOSIS — Z7952 Long term (current) use of systemic steroids: Secondary | ICD-10-CM | POA: Insufficient documentation

## 2017-04-28 HISTORY — PX: KYPHOPLASTY: SHX5884

## 2017-04-28 SURGERY — KYPHOPLASTY
Anesthesia: General | Site: Spine Thoracic | Wound class: "Clean "

## 2017-04-28 MED ORDER — PROPOFOL 10 MG/ML IV BOLUS
INTRAVENOUS | Status: DC | PRN
  Administered 2017-04-28 (×2): 20 mg via INTRAVENOUS

## 2017-04-28 MED ORDER — CEFAZOLIN SODIUM-DEXTROSE 2-4 GM/100ML-% IV SOLN
INTRAVENOUS | Status: AC
  Filled 2017-04-28: qty 100

## 2017-04-28 MED ORDER — LACTATED RINGERS IV SOLN
INTRAVENOUS | Status: DC
  Administered 2017-04-28: 08:00:00 via INTRAVENOUS

## 2017-04-28 MED ORDER — LIDOCAINE HCL (PF) 1 % IJ SOLN
INTRAMUSCULAR | Status: AC
Start: 2017-04-28 — End: ?
  Filled 2017-04-28: qty 30

## 2017-04-28 MED ORDER — BUPIVACAINE-EPINEPHRINE (PF) 0.5% -1:200000 IJ SOLN
INTRAMUSCULAR | Status: AC
  Filled 2017-04-28: qty 30

## 2017-04-28 MED ORDER — IOPAMIDOL (ISOVUE-M 200) INJECTION 41%
INTRAMUSCULAR | Status: DC | PRN
  Administered 2017-04-28: 20 mL

## 2017-04-28 MED ORDER — LIDOCAINE HCL 1 % IJ SOLN
INTRAMUSCULAR | Status: DC | PRN
  Administered 2017-04-28: 30 mL

## 2017-04-28 MED ORDER — FENTANYL CITRATE (PF) 100 MCG/2ML IJ SOLN
25.0000 ug | INTRAMUSCULAR | Status: DC | PRN
  Administered 2017-04-28: 50 ug via INTRAVENOUS

## 2017-04-28 MED ORDER — MIDAZOLAM HCL 2 MG/2ML IJ SOLN
INTRAMUSCULAR | Status: DC | PRN
  Administered 2017-04-28: 2 mg via INTRAVENOUS

## 2017-04-28 MED ORDER — FAMOTIDINE 20 MG PO TABS: 20.0000 mg | ORAL_TABLET | Freq: Once | ORAL | Status: DC

## 2017-04-28 MED ORDER — PROMETHAZINE HCL 25 MG/ML IJ SOLN: 6.2500 mg | INTRAMUSCULAR | Status: DC | PRN

## 2017-04-28 MED ORDER — IOPAMIDOL (ISOVUE-M 200) INJECTION 41%
INTRAMUSCULAR | Status: AC
  Filled 2017-04-28: qty 20

## 2017-04-28 MED ORDER — FENTANYL CITRATE (PF) 100 MCG/2ML IJ SOLN
INTRAMUSCULAR | Status: AC
  Filled 2017-04-28: qty 2

## 2017-04-28 MED ORDER — PROPOFOL 500 MG/50ML IV EMUL
INTRAVENOUS | Status: DC | PRN
Start: 2017-04-28 — End: 2017-04-28
  Administered 2017-04-28: 75 ug/kg/min via INTRAVENOUS

## 2017-04-28 MED ORDER — BUPIVACAINE-EPINEPHRINE (PF) 0.5% -1:200000 IJ SOLN
INTRAMUSCULAR | Status: DC | PRN
  Administered 2017-04-28: 20 mL

## 2017-04-28 MED ORDER — FAMOTIDINE 20 MG PO TABS
ORAL_TABLET | ORAL | Status: AC
  Administered 2017-04-28: 20 mg
  Filled 2017-04-28: qty 1

## 2017-04-28 MED ORDER — MIDAZOLAM HCL 2 MG/2ML IJ SOLN
INTRAMUSCULAR | Status: AC
  Filled 2017-04-28: qty 2

## 2017-04-28 MED ORDER — MEPERIDINE HCL 50 MG/ML IJ SOLN: 6.2500 mg | INTRAMUSCULAR | Status: DC | PRN

## 2017-04-28 SURGICAL SUPPLY — 16 items
CEMENT KYPHON CX01A KIT/MIXER (Cement) ×2 IMPLANT
DERMABOND ADVANCED (GAUZE/BANDAGES/DRESSINGS) ×1
DERMABOND ADVANCED .7 DNX12 (GAUZE/BANDAGES/DRESSINGS) ×1 IMPLANT
DEVICE BIOPSY BONE KYPHX (INSTRUMENTS) ×2 IMPLANT
DRAPE C-ARM XRAY 36X54 (DRAPES) ×2 IMPLANT
DURAPREP 26ML APPLICATOR (WOUND CARE) ×2 IMPLANT
GLOVE SURG SYN 9.0  PF PI (GLOVE) ×1
GLOVE SURG SYN 9.0 PF PI (GLOVE) ×1 IMPLANT
GOWN SRG 2XL LVL 4 RGLN SLV (GOWNS) ×1 IMPLANT
GOWN STRL NON-REIN 2XL LVL4 (GOWNS) ×1
GOWN STRL REUS W/ TWL LRG LVL3 (GOWN DISPOSABLE) ×1 IMPLANT
GOWN STRL REUS W/TWL LRG LVL3 (GOWN DISPOSABLE) ×1
PACK KYPHOPLASTY (MISCELLANEOUS) ×2 IMPLANT
STRAP SAFETY 5IN WIDE (MISCELLANEOUS) ×2 IMPLANT
TRAY KYPHOPAK 15/3 EXPRESS 1ST (MISCELLANEOUS) ×1 IMPLANT
TRAY KYPHOPAK 20/3 EXPRESS 1ST (MISCELLANEOUS) ×2 IMPLANT

## 2017-04-28 NOTE — Anesthesia Procedure Notes (Signed)
Date/Time: 04/28/2017 8:44 AM Performed by: Junious SilkNoles, Alvetta Hidrogo, CRNA Pre-anesthesia Checklist: Patient identified, Emergency Drugs available, Suction available, Patient being monitored and Timeout performed Oxygen Delivery Method: Nasal cannula

## 2017-04-28 NOTE — H&P (Signed)
Reviewed paper H+P, will be scanned into chart. No changes noted.  

## 2017-04-28 NOTE — Anesthesia Post-op Follow-up Note (Signed)
Anesthesia QCDR form completed.        

## 2017-04-28 NOTE — Op Note (Signed)
04/28/2017  9:18 AM  PATIENT:  Helen Nguyen  62 y.o. female  PRE-OPERATIVE DIAGNOSIS:  CLOSED COMPRESSION FRACTURE OF L2  POST-OPERATIVE DIAGNOSIS:  CLOSED COMPRESSION FRACTURE OF L2  PROCEDURE:  Procedure(s): KYPHOPLASTY-L2 (N/A)  SURGEON: Laurene Footman, MD  ASSISTANTS: None  ANESTHESIA:   local and MAC  EBL:  Total I/O In: 300 [I.V.:300] Out: 5 [Blood:5]  BLOOD ADMINISTERED:none  DRAINS: none   LOCAL MEDICATIONS USED:  MARCAINE    and XYLOCAINE   SPECIMEN:  Source of Specimen:  L2 vertebral body  DISPOSITION OF SPECIMEN:  PATHOLOGY  COUNTS:  YES  TOURNIQUET:  * No tourniquets in log *  IMPLANTS: Bone cement  DICTATION: .Dragon Dictation  patient was brought to the operating room and after adequate sedation was obtained the patient was placed prone.  Serum was brought in both AP and lateral projections and good visualization of the fracture L2 was obtained.  After patient identification and timeout procedure were completed 10 cc of 1% Xylocaine was infiltrated on the right side at the area of the planned incision.  The back was then prepped and draped in usual sterile fashion and repeat timeout procedure carried out.  Spinal needle was brought down to the pedicle on the right at L2 and a 50-50 mix of 1% Xylocaine half percent Sensorcaine with epinephrine a total of 40 cc injected along the path to the skin.  After allowing this to set a small incision was made and a trocar was advanced in an extrapedicular fashion into the vertebral body where a biopsy was obtained.  Drilling was carried out followed by placement of balloon and inflation to 5.0cc.  When the cement was the appropriate consistency the balloon was let down and 6.0 cc of bone cement was used to infiltrate the body and get very good fill interdigitation and fill from medial to lateral on both sides as well as superior to inferior endplates.  When the cement was set trochars removed and the wound closed with  Dermabond followed by Band-Aid    PLAN OF CARE: Discharge to home after PACU  PATIENT DISPOSITION:  PACU - hemodynamically stable.

## 2017-04-28 NOTE — Transfer of Care (Signed)
Immediate Anesthesia Transfer of Care Note  Patient: Helen Nguyen  Procedure(s) Performed: Nicki ReaperKYPHOPLASTY-L2 (N/A Spine Thoracic)  Patient Location: PACU  Anesthesia Type:General  Level of Consciousness: awake and sedated  Airway & Oxygen Therapy: Patient Spontanous Breathing and Patient connected to nasal cannula oxygen  Post-op Assessment: Report given to RN and Post -op Vital signs reviewed and stable  Post vital signs: Reviewed and stable  Last Vitals:  Vitals Value Taken Time  BP    Temp    Pulse 89 04/28/2017  9:16 AM  Resp 24 04/28/2017  9:16 AM  SpO2 100 % 04/28/2017  9:16 AM  Vitals shown include unvalidated device data.  Last Pain:  Vitals:   04/28/17 0735  TempSrc: Oral  PainSc: 8       Patients Stated Pain Goal: 0 (04/28/17 0735)  Complications: No apparent anesthesia complications

## 2017-04-28 NOTE — Anesthesia Preprocedure Evaluation (Signed)
Anesthesia Evaluation  Patient identified by MRN, date of birth, ID band Patient awake    Reviewed: Allergy & Precautions, NPO status , Patient's Chart, lab work & pertinent test results  History of Anesthesia Complications Negative for: history of anesthetic complications  Airway Mallampati: II  TM Distance: >3 FB Neck ROM: Full    Dental no notable dental hx.    Pulmonary neg pulmonary ROS, neg sleep apnea, neg COPD,    breath sounds clear to auscultation- rhonchi (-) wheezing      Cardiovascular Exercise Tolerance: Good (-) hypertension(-) CAD, (-) Past MI, (-) Cardiac Stents and (-) CABG  Rhythm:Regular Rate:Normal - Systolic murmurs and - Diastolic murmurs    Neuro/Psych  Headaches, PSYCHIATRIC DISORDERS Anxiety Depression    GI/Hepatic Neg liver ROS, PUD,   Endo/Other  neg diabetesHypothyroidism   Renal/GU negative Renal ROS     Musculoskeletal  (+) Arthritis ,   Abdominal (+) - obese,   Peds  Hematology negative hematology ROS (+)   Anesthesia Other Findings Past Medical History: No date: Anxiety No date: Cerebral palsy (HCC) 1976: Concussion No date: Depression 07/02/2015, 01/05/2017: 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 No date: Headache No date: History of migraine headaches No date: Hypercholesterolemia No date: Hypothyroidism No date: Idiopathic thrombocytopenic purpura (ITP) (HCC) 2008: Peptic ulcer disease No date: Thyroid disease 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   Reproductive/Obstetrics                             Anesthesia Physical Anesthesia Plan  ASA: II  Anesthesia Plan: General   Post-op Pain Management:    Induction: Intravenous  PONV Risk Score and Plan: 2 and Propofol infusion and Ondansetron  Airway Management Planned: Natural Airway  Additional Equipment:   Intra-op Plan:   Post-operative Plan:   Informed Consent: I have reviewed the patients  History and Physical, chart, labs and discussed the procedure including the risks, benefits and alternatives for the proposed anesthesia with the patient or authorized representative who has indicated his/her understanding and acceptance.   Dental advisory given  Plan Discussed with: CRNA and Anesthesiologist  Anesthesia Plan Comments:         Anesthesia Quick Evaluation

## 2017-04-28 NOTE — Anesthesia Postprocedure Evaluation (Signed)
Anesthesia Post Note  Patient: Helen Nguyen  Procedure(s) Performed: Nicki ReaperKYPHOPLASTY-L2 (N/A Spine Thoracic)  Patient location during evaluation: PACU Anesthesia Type: General Level of consciousness: awake and alert and oriented Pain management: pain level controlled Vital Signs Assessment: post-procedure vital signs reviewed and stable Respiratory status: spontaneous breathing, nonlabored ventilation and respiratory function stable Cardiovascular status: blood pressure returned to baseline and stable Postop Assessment: no signs of nausea or vomiting Anesthetic complications: no     Last Vitals:  Vitals:   04/28/17 0955 04/28/17 1002  BP:  (!) 150/89  Pulse:  65  Resp:  14  Temp: 37.1 C   SpO2:  98%    Last Pain:  Vitals:   04/28/17 1001  TempSrc:   PainSc: 3                  Mayford Alberg

## 2017-04-28 NOTE — Discharge Instructions (Signed)
AMBULATORY SURGERY  °DISCHARGE INSTRUCTIONS ° ° °1) The drugs that you were given will stay in your system until tomorrow so for the next 24 hours you should not: ° °A) Drive an automobile °B) Make any legal decisions °C) Drink any alcoholic beverage ° ° °2) You may resume regular meals tomorrow.  Today it is better to start with liquids and gradually work up to solid foods. ° °You may eat anything you prefer, but it is better to start with liquids, then soup and crackers, and gradually work up to solid foods. ° ° °3) Please notify your doctor immediately if you have any unusual bleeding, trouble breathing, redness and pain at the surgery site, drainage, fever, or pain not relieved by medication. ° ° ° °4) Additional Instructions: ° ° ° ° ° ° ° °Please contact your physician with any problems or Same Day Surgery at [phone], Monday through Friday 6 am to 4 pm, or [place] [facility] [place] [facility] number at [phone]. °

## 2017-04-29 LAB — SURGICAL PATHOLOGY

## 2017-07-12 DIAGNOSIS — M48062 Spinal stenosis, lumbar region with neurogenic claudication: Secondary | ICD-10-CM | POA: Insufficient documentation

## 2017-07-29 DIAGNOSIS — M810 Age-related osteoporosis without current pathological fracture: Secondary | ICD-10-CM | POA: Insufficient documentation

## 2019-07-06 IMAGING — CR DG LUMBAR SPINE COMPLETE 4+V
5 series · 5 of 5 positions shown · non-contrast
Comparison: None.

CLINICAL DATA: Acute bilateral low back pain after lifting injury

EXAM:
LUMBAR SPINE - COMPLETE 4+ VIEW

[l-spine ap]
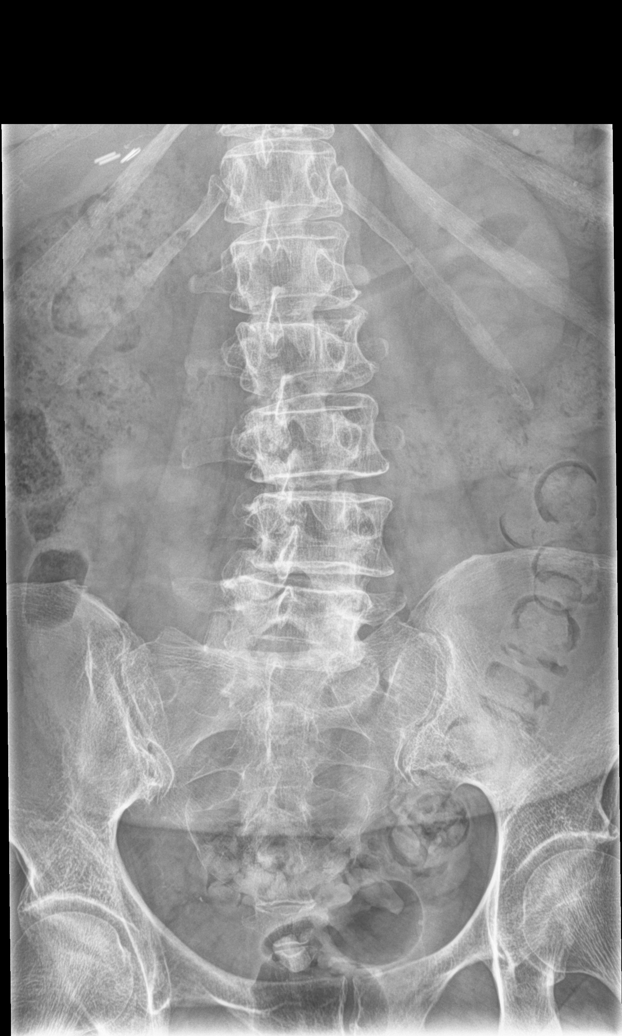

[l-spine obl (1 of 2)]
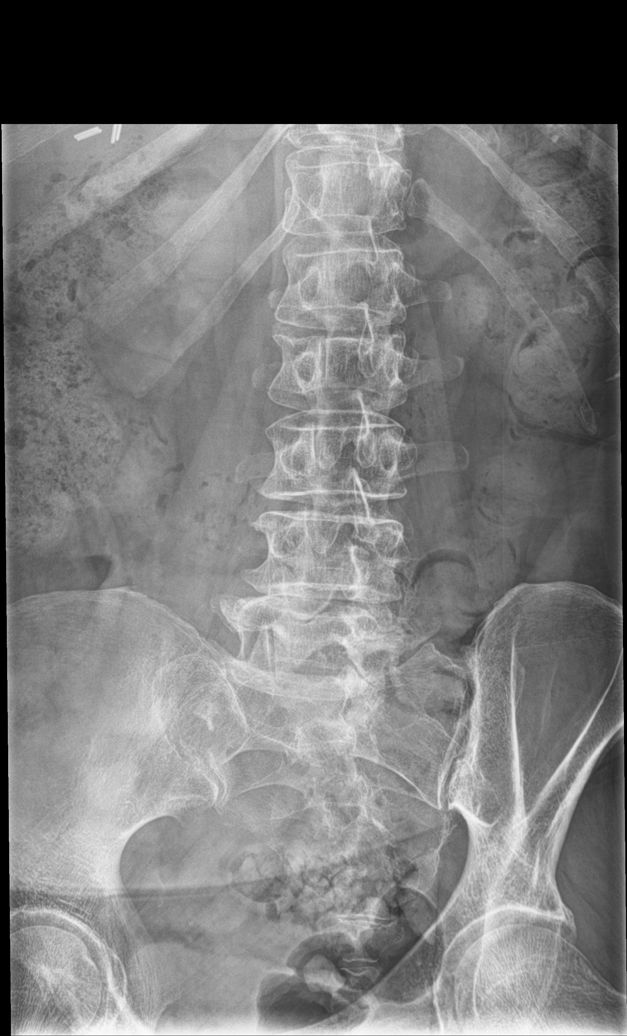

[l-spine obl (2 of 2)]
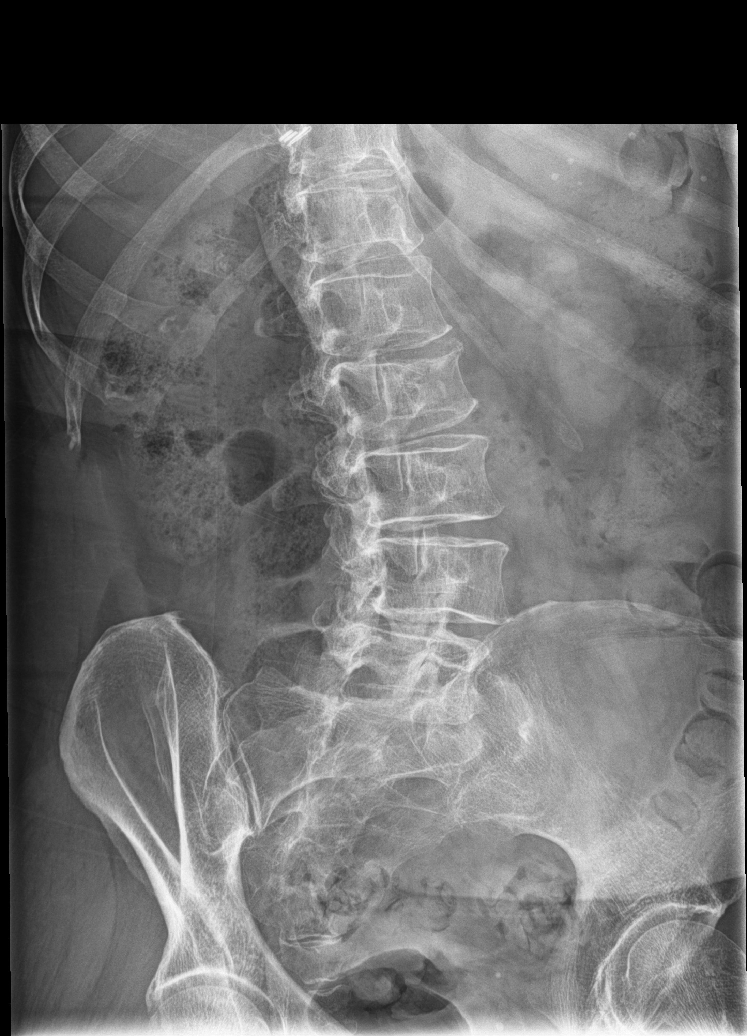

[l-spine lat]
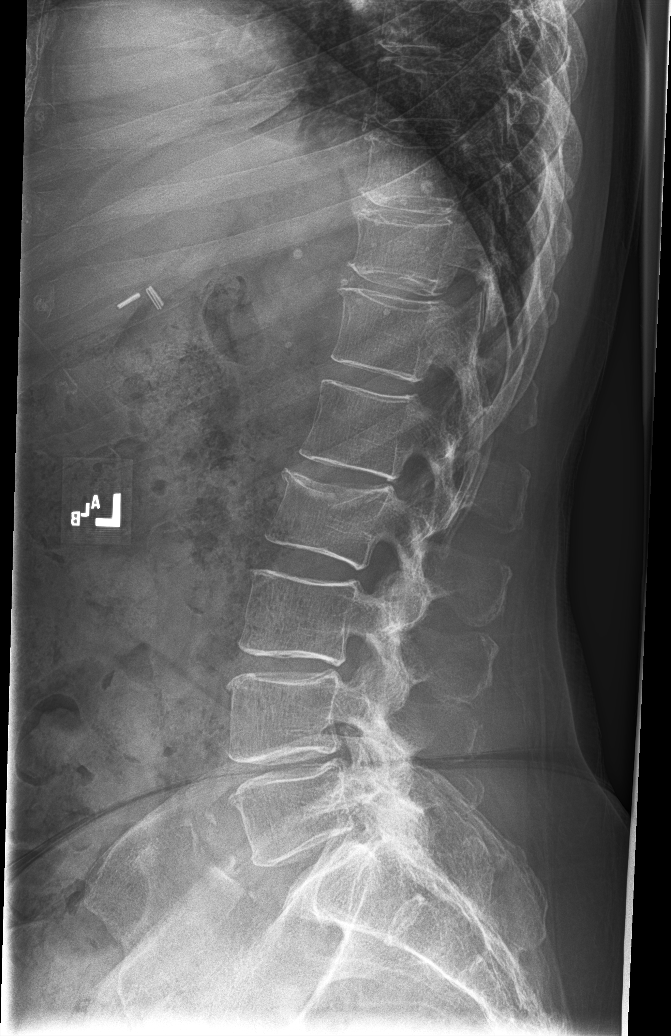

[l-spine spot]
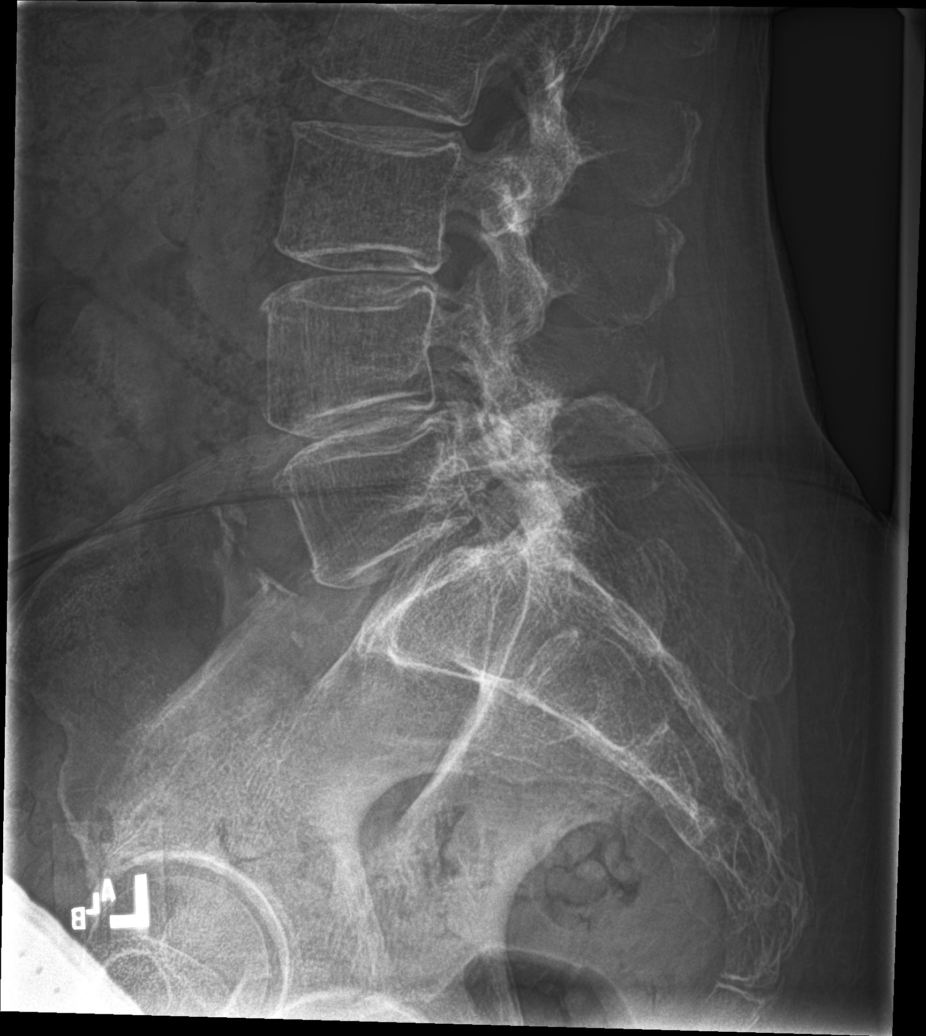

[5 of 5 positions shown; findings below may reference images not displayed]

FINDINGS: There is slight curvature of the lumbar spine convex left by
approximately 19 degrees. On the lateral view there is a mild
compression deformity of the superior endplate of L2 of
approximately 10%. No retropulsion is seen. This may be acute or
subacute. There is degenerative disc disease at L4-5. The SI joints
appear corticated. The bowel gas pattern is nonspecific.
IMPRESSION: 1. Mild compression deformity of the anterior superior aspect of L2
most likely acute or subacute. No retropulsion.
2. Mild degenerative disc disease at L4-5.
3. Slight lumbar curvature convex to the left.

## 2019-08-10 ENCOUNTER — Other Ambulatory Visit: Payer: Self-pay

## 2019-08-10 ENCOUNTER — Ambulatory Visit
Admission: RE | Admit: 2019-08-10 | Discharge: 2019-08-10 | Disposition: A | Discharge status: Home / Self Care | Payer: Federal, State, Local not specified - PPO | Attending: Family Medicine | Admitting: Family Medicine

## 2019-08-10 ENCOUNTER — Ambulatory Visit
Admission: RE | Admit: 2019-08-10 | Discharge: 2019-08-10 | Disposition: A | Discharge status: Home / Self Care | Payer: Federal, State, Local not specified - PPO | Source: Ambulatory Visit | Attending: Family Medicine | Admitting: Family Medicine

## 2019-08-10 ENCOUNTER — Other Ambulatory Visit: Payer: Self-pay | Admitting: Family Medicine

## 2019-08-10 DIAGNOSIS — M25552 Pain in left hip: Secondary | ICD-10-CM | POA: Insufficient documentation

## 2019-12-27 NOTE — Progress Notes (Signed)
[facility]  [address], [address] [place], [place] [address] Phone: [phone]  Fax: [phone]   Clinic Day:  12/31/2019  Referring physician: Rayetta Humphrey, MD  Chief Complaint: Helen Nguyen is a 64 y.o. female with thrombocytopenia who is referred in consultation by Dr. Angus Palms for assessment and management.   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 She presented with bruising. Platelet count was 70,000.  Bone marrow aspirate and biopsy by report was negative.  She only required treatment during pregnancy.  In 1994, during her first pregnancy, she was treated with prednisone and IVIG throughout pregnancy because her platelet count decreased to 20,000. During her second pregnancy in 1999, she was treated with prednisone alone. She was diagnosed with gangrenous hemorrhoids during her second pregnancy.  When she was 8 months pregnant with her second pregnancy, she required platelet transfusion with hemorrhoidectomy. She had a C section with no major bleeding.  Her baseline platelet count is in the low 120,000 range.  Labs in 06/2012 revealed a platelet count of 62,000.  She was referred to Dr. Bernie Covey at [facility].  She was noted not to have had bleeding issues with procedures and no history of major bleeding s/p several surgeries (TMJ repair x3, cholecystectomy, left carpal tunnel release and C-section).  Prior evaluation was negative for: HIV, hepatitis C.  She is hepatitis B immune.  Abdomen and pelvis CT in 2009 raised a concern for splenomegaly, multiple granulomas in the spleen were felt c/w prior granulomatous disease.  Platelet count on 11/09/2012 was 93,000.  Peripheral smear revealed platelets decreased in number with some large platelets seen.  It was felt that as long as her platelets were consistently > 30,000 without clinically significant  bleeding, it was reasonable to continue observation.  If platelets dropped below 30,000 or she had significant bleeding, treatment was recommended.  She was scheduled to return for follow-up in 1 year  The patient last saw Dr. Greggory Stallion on 05/25/2019 for annual exam.  She was noted to have immune mediated thrombocytopenic purpura (ITP).  She had previously been followed by hematology.  Labs followed: 06/16/2005: Hematocrit 38.0, hemoglobin 12.7, platelets 148,000, WBC 5,300. 02/17/2007: Hematocrit 33.0, hemoglobin 11.1, platelets 118,000, WBC 7,600. 08/26/2010: Hematocrit 36.0, hemoglobin 12.3, platelets 126,000, WBC 6,100. 07/10/2012: Hematocrit 38.0, hemoglobin 13.1, platelets   62,000, WBC 5,500. 11/05/2014: Hematocrit 42.9, hemoglobin 14.1, platelets 197,000, WBC 8,500. 12/05/2015: Hematocrit 38.6, hemoglobin 13.0, platelets 147,000, WBC 6,100. 01/05/2017: Hematocrit 37.3, hemoglobin 12.8, platelets 156,000, WBC 6,800. 05/25/2019: Hematocrit 38.7, hemoglobin 12.7, platelets 103,000, WBC 6,400. 12/17/2019: Hematocrit 36.8, hemoglobin 12.4, platelets   96,000, WBC 6,300.  Symptomatically, she feels fine. She has occasional palpitations and reflux. 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 She has osteoporosis and spinal stenosis. She bruises easily, especially on her stomach.  She denies fevers, sweats, headaches, changes in vision, runny nose, sore throat, cough, shortness of breath, chest pain, nausea, vomiting, diarrhea, urinary symptoms, bone or joint symptoms, skin changes, numbness, weakness, balance or coordination problems, and bleeding of any kind.  The patient denies any new medications. She started taking turmeric 3 weeks ago. She describes her diet as "fair."  She thinks that she had an ANA done in the past but it was inconclusive. She does not think that she has ever had platelet clumping.  Her maternal aunt had breast cancer. Two of her brothers had lung cancer. A  sister died of lupus at age 63; she had thrombocytopenia.  Another sister died of metastatic carcinoma of unknown origin at 3263 (had cancer in the breast, ovary, and lung). She has a 63 year old son with autism and ADHD.   Past Medical History:  Diagnosis Date  . Anxiety   . Cerebral palsy (HCC)   . Concussion 1976  . Depression   . 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 Gallbladder disease 2008  . Headache   . History of migraine headaches   . Hypercholesterolemia   . Hypothyroidism   . Idiopathic thrombocytopenic purpura (ITP) (HCC)   . Osteoporosis   . Peptic ulcer disease 2008  . Thyroid disease   . TMJ arthritis     Past Surgical History:  Procedure Laterality Date  . CARPAL TUNNEL RELEASE Left 1984  . CATARACT EXTRACTION W/ INTRAOCULAR LENS IMPLANT Right 12/2011   Dr. Inez PilgrimBrasington (Toric Lens)  . CESAREAN SECTION    . CHOLECYSTECTOMY  2008  . HEMORROIDECTOMY    . KYPHOPLASTY N/A 04/28/2017   Procedure: Nicki ReaperKYPHOPLASTY-L2;  Surgeon: Kennedy BuckerMenz, Michael, MD;  Location: [facility] ORS;  Service: Orthopedics;  Laterality: N/A;  . TMJ ARTHROPLASTY  1984, 2005, 2006   then removal of prosthesis  . TUBAL LIGATION      Family History  Problem Relation Age of Onset  . Heart disease Mother   . Cataracts Mother   . Glaucoma Mother   . Heart disease Father   . Ovarian cancer Sister   . Lung cancer Brother     Social History:  reports that she has never smoked. She has never used smokeless tobacco. She reports current alcohol use. She reports that she does not use drugs. She denies any tobacco use. She drinks wine 3x per month. She denies exposure to radiation or toxins. She lives with her husband, Homero FellersFrank, in [place]. She used to live in [place]. 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  She lives in [place].  The patient is alone today.  Allergies:   Allergies  Allergen Reactions  . Codeine Nausea And Vomiting    Current Medications: Current Outpatient Medications  Medication Sig Dispense Refill  . levothyroxine (SYNTHROID, LEVOTHROID) 125 MCG tablet Take 125 mcg by mouth daily before breakfast.    . naproxen sodium (ALEVE) 220 MG tablet Take 440 mg by mouth 2 (two) times daily as needed.    . propranolol (INDERAL) 20 MG tablet Take 20 mg by mouth 2 (two) times daily.    . sertraline (ZOLOFT) 100 MG tablet Take 100 mg by mouth daily.     . traMADol (ULTRAM) 50 MG tablet Take 50 mg by mouth every 6 (six) hours as needed for moderate pain.     . predniSONE (DELTASONE) 20 MG tablet Take 2 tablets (40 mg total) by mouth daily. (Patient not taking: Reported on 12/31/2019) 10 tablet 0   No current facility-administered medications for this visit.    Review of Systems  Constitutional: Negative for chills, diaphoresis, fever, malaise/fatigue and weight loss.  HENT: Negative for congestion, ear discharge, ear pain, hearing loss, nosebleeds, sinus pain, sore throat and tinnitus.   Eyes: Negative for blurred vision.  Respiratory: Negative for cough, hemoptysis, sputum production and shortness of breath.   Cardiovascular: Positive for palpitations (occasional). Negative for chest pain and leg swelling.  Gastrointestinal: Positive for heartburn. Negative for abdominal  pain, blood in stool, constipation, diarrhea, melena, nausea and vomiting.       "Fair diet."  Genitourinary: Negative for dysuria, frequency, hematuria and urgency.  Musculoskeletal: Negative for back pain, joint pain, myalgias and neck pain.       Osteoporosis. Spinal stenosis. Osteoarthritis of the jaw.  Skin: Negative for itching and rash.  Neurological: Negative for dizziness, tingling, sensory change, weakness and headaches.  Endo/Heme/Allergies: Bruises/bleeds easily.  Psychiatric/Behavioral: Negative for depression and memory loss. The patient is not nervous/anxious and  does not have insomnia.   All other systems reviewed and are negative.  Performance status (ECOG): 1  Vitals Blood pressure 127/78, pulse 63, temperature 97.8 F (36.6 C), temperature source Tympanic, resp. rate 18, weight 163 lb 9.3 oz (74.2 kg), SpO2 97 %.   Physical Exam Vitals and nursing note reviewed.  Constitutional:      General: She is not in acute distress.    Appearance: She is not diaphoretic.  HENT:     Head: Normocephalic and atraumatic.     Comments: Curly short brown hair.    Mouth/Throat:     Mouth: Mucous membranes are moist.     Pharynx: Oropharynx is clear.  Eyes:     General: No scleral icterus.    Extraocular Movements: Extraocular movements intact.     Conjunctiva/sclera: Conjunctivae normal.     Pupils: Pupils are equal, round, and reactive to light.     Comments: Glasses.  Cardiovascular:     Rate and Rhythm: Normal rate and regular rhythm.     Heart sounds: Normal heart sounds. No murmur heard.   Pulmonary:     Effort: Pulmonary effort is normal. No respiratory distress.     Breath sounds: Normal breath sounds. No wheezing or rales.  Chest:     Chest wall: No tenderness.  Breasts:     Right: No axillary adenopathy or supraclavicular adenopathy.     Left: No axillary adenopathy or supraclavicular adenopathy.    Abdominal:     General: Bowel sounds are normal. There is no distension.     Palpations: Abdomen is soft. There is no mass.     Tenderness: There is no abdominal tenderness. There is no guarding or rebound.  Musculoskeletal:        General: No swelling or tenderness. Normal range of motion.     Cervical back: Normal range of motion and neck supple.  Lymphadenopathy:     Head:     Right side of head: No preauricular, posterior auricular or occipital adenopathy.     Left side of head: No preauricular, posterior auricular or occipital adenopathy.     Cervical: No cervical adenopathy.     Upper Body:     Right upper body: No  supraclavicular or axillary adenopathy.     Left upper body: No supraclavicular or axillary adenopathy.     Lower Body: No right inguinal adenopathy. No left inguinal adenopathy.  Skin:    General: Skin is warm and dry.  Neurological:     Mental Status: She is alert and oriented to person, place, and time.  Psychiatric:        Behavior: Behavior normal.        Thought Content: Thought content normal.        Judgment: Judgment normal.    No visits with results within 3 Day(s) from this visit.  Latest known visit with results is:  Admission on 04/28/2017, Discharged on 04/28/2017  Component Date Value Ref Range  Status  . SURGICAL PATHOLOGY 04/28/2017    Final                   Value:Surgical Pathology CASE: [id number] PATIENT: Helen Nguyen Surgical Pathology Report  SPECIMEN SUBMITTED: A. L2 Bone biopsy  CLINICAL HISTORY: None provided  PRE-OPERATIVE DIAGNOSIS: Closed compression fracture of L2  POST-OPERATIVE DIAGNOSIS: Same as pre op  DIAGNOSIS: A. BONE, L2; BIOPSY: - BONE AND MARROW STROMA WITH HEMORRHAGE AND REACTIVE CHANGES CONSISTENT WITH PROVIDED HISTORY OF FRACTURE. - TRILINEAGE HEMATOPOIESIS.  GROSS DESCRIPTION:  A. Labeled: L2 kypho bone biopsy  Tissue fragment(s): multiple  Size: aggregate, 1.8 x 1.3 x 0.1 cm  Description: in formalin, red to tan boney and soft material  Entirely following decalcification submitted in one cassette(s).  Final Diagnosis performed by Elijah Birk, MD.  Electronically signed 04/29/2017 12:52:21PM  The electronic signature indicates that the named Attending Pathologist has evaluated the specimen  Technical component performed at [facility], [address] [place], [place] [address] Lab: [phone] Dir: Jolene Schimke, MD, MMM  Professional component performed at [facility], [address] [place], [place], [place] [address] Lab: [phone] Dir: Georgiann Cocker. Oneita Kras, MD     Assessment:  Helen Nguyen is a 64 y.o. female with chromic immune mediated thrombocytopenic purpura (ITP).  She was initially diagnosed in 79 while living in [place].  She has requred treatment on 2 occasions during pregnancy.  In 1994, during her first pregnancy, she was treated with prednisone and IVIG.  During her second pregnancy in 1999, she was treated with prednisone alone. Baseline platelet count is 120,000.  Bone marrow aspirate and biopsy was negative (by report).  Prior evaluation was negative for: HIV, hepatitis C.  She is hepatitis B immune.  Abdomen and pelvis CT in 2009 raised a concern for splenomegaly multiple granulomas in the spleen felt c/w prior granulomatous disease.  CBC on 12/17/2019 revealed a hematocrit 36.8, hemoglobin 12.4, platelets   96,000, WBC 6,300.  TSH was normal on 11/19/2019.  Symptomatically, she feels fine. She has occasional palpitations and reflux. She bruises easily.  She denies any B symptoms.  He denies any new medications or herbal products exam reveals no adenopathy or hepatosplenomegaly.  Plan: 1.   Labs today:  CBC with diff, CMP, IPF, platelet count in a blue top tube, hepatitis C antibody, H. pylori antibody, ANA with reflex. 2.   Peripheral smear for pathologic review. 3.   Chronic immune mediated thrombocytopenic purpura (ITP)  Review entire medical history and treatment to date.  Platelet count has typically ranged around 120,000.  Discuss plan for treatment if platelets < 30,000.  Treatments for ITP were discussed in detail including steroids, IVIG, Rituxan, Nplate, Promacta.  Anticipate ongoing observation.  Patient encouraged to contact clinic if she notices increased bruising or bleeding 4.   RTC in 1 week for MD assessment (phone or video) for review of work-up. 5.   RTC in 1 year for MD assessment and labs (CBC with diff +/- others).   I discussed the assessment and treatment plan with the patient.  The patient was  provided an opportunity to ask questions and all were answered.  The patient agreed with the plan and demonstrated an understanding of the instructions.  The patient was advised to call back if the symptoms worsen or if the condition fails  to improve as anticipated.  I provided 28 minutes of face-to-face time during this this encounter and > 50% was spent counseling as documented under my assessment and plan.  An additional 15 minutes were spent reviewing her chart (Epic and Care Everywhere) including notes, labs, and imaging studies.    Ezra Denne C. Merlene Pulling, MD, PhD    12/31/2019, 2:59 PM  I, Danella Penton Tufford, am acting as Neurosurgeon for [employer]. Merlene Pulling, MD, PhD.  I, Kaydense Rizo C. Merlene Pulling, MD, have reviewed the above documentation for accuracy and completeness, and I agree with the above.

## 2019-12-31 ENCOUNTER — Other Ambulatory Visit: Payer: Self-pay

## 2019-12-31 ENCOUNTER — Inpatient Hospital Stay
Payer: Federal, State, Local not specified - PPO | Attending: Hematology and Oncology | Admitting: Hematology and Oncology

## 2019-12-31 ENCOUNTER — Encounter: Payer: Self-pay | Admitting: Hematology and Oncology

## 2019-12-31 ENCOUNTER — Inpatient Hospital Stay: Payer: Federal, State, Local not specified - PPO

## 2019-12-31 VITALS — BP 127/78 | HR 63 | Temp 97.8°F | Resp 18 | Wt 163.6 lb

## 2019-12-31 DIAGNOSIS — E039 Hypothyroidism, unspecified: Secondary | ICD-10-CM | POA: Diagnosis not present

## 2019-12-31 DIAGNOSIS — D693 Immune thrombocytopenic purpura: Secondary | ICD-10-CM

## 2019-12-31 LAB — CBC WITH DIFFERENTIAL/PLATELET
Abs Immature Granulocytes: 0.05 10*3/uL (ref 0.00–0.07)
Basophils Absolute: 0 10*3/uL (ref 0.0–0.1)
Basophils Relative: 1 %
Eosinophils Absolute: 0.2 10*3/uL (ref 0.0–0.5)
Eosinophils Relative: 2 %
HCT: 38 % (ref 36.0–46.0)
Hemoglobin: 12.9 g/dL (ref 12.0–15.0)
Immature Granulocytes: 1 %
Lymphocytes Relative: 37 %
Lymphs Abs: 2.9 10*3/uL (ref 0.7–4.0)
MCH: 29.5 pg (ref 26.0–34.0)
MCHC: 33.9 g/dL (ref 30.0–36.0)
MCV: 86.8 fL (ref 80.0–100.0)
Monocytes Absolute: 0.6 10*3/uL (ref 0.1–1.0)
Monocytes Relative: 7 %
Neutro Abs: 4.2 10*3/uL (ref 1.7–7.7)
Neutrophils Relative %: 52 %
Platelets: 128 10*3/uL — ABNORMAL LOW (ref 150–400)
RBC: 4.38 MIL/uL (ref 3.87–5.11)
RDW: 13.1 % (ref 11.5–15.5)
WBC: 7.9 10*3/uL (ref 4.0–10.5)
nRBC: 0 % (ref 0.0–0.2)

## 2019-12-31 LAB — COMPREHENSIVE METABOLIC PANEL
ALT: 20 U/L (ref 0–44)
AST: 22 U/L (ref 15–41)
Albumin: 4.3 g/dL (ref 3.5–5.0)
Alkaline Phosphatase: 51 U/L (ref 38–126)
Anion gap: 12 (ref 5–15)
BUN: 22 mg/dL (ref 8–23)
CO2: 25 mmol/L (ref 22–32)
Calcium: 9.7 mg/dL (ref 8.9–10.3)
Chloride: 97 mmol/L — ABNORMAL LOW (ref 98–111)
Creatinine, Ser: 0.76 mg/dL (ref 0.44–1.00)
GFR, Estimated: >60 mL/min (ref >60)
Glucose, Bld: 109 mg/dL — ABNORMAL HIGH (ref 70–99)
Potassium: 4.2 mmol/L (ref 3.5–5.1)
Sodium: 134 mmol/L — ABNORMAL LOW (ref 135–145)
Total Bilirubin: 0.5 mg/dL (ref 0.3–1.2)
Total Protein: 7.4 g/dL (ref 6.5–8.1)

## 2019-12-31 LAB — PLATELET BY CITRATE: Platelet CT in Citrate: 123.2

## 2019-12-31 LAB — IMMATURE PLATELET FRACTION: Immature Platelet Fraction: 5.4 % (ref 1.2–8.6)

## 2019-12-31 NOTE — Patient Instructions (Signed)
  Contact clinic if any unexplained excess bruising or bleeding for CBC.

## 2020-01-01 LAB — ENA+DNA/DS+SJORGEN'S
ENA SM Ab Ser-aCnc: <0.2 AI (ref 0.0–0.9)
Ribonucleic Protein: 0.3 AI (ref 0.0–0.9)
SSA (Ro) (ENA) Antibody, IgG: 2.2 AI — ABNORMAL HIGH (ref 0.0–0.9)
SSB (La) (ENA) Antibody, IgG: <0.2 AI (ref 0.0–0.9)
ds DNA Ab: 50 IU/mL — ABNORMAL HIGH (ref 0–9)

## 2020-01-01 LAB — HEPATITIS C ANTIBODY: HCV Ab: NONREACTIVE

## 2020-01-01 LAB — ANA W/REFLEX: Anti Nuclear Antibody (ANA): POSITIVE — AB

## 2020-01-01 LAB — H. PYLORI ANTIBODY, IGG: H Pylori IgG: 0.24 Index Value (ref 0.00–0.79)

## 2020-01-04 ENCOUNTER — Other Ambulatory Visit: Payer: Self-pay | Admitting: Orthopedic Surgery

## 2020-01-04 DIAGNOSIS — M4807 Spinal stenosis, lumbosacral region: Secondary | ICD-10-CM

## 2020-01-07 NOTE — Progress Notes (Signed)
[facility]  [address] [place], [place] [address] Phone: [phone]  Fax: [phone]   Telephone Office Visit:  01/16/2020  Referring physician: Rayetta Humphrey, MD  I connected with Helen Nguyen on 01/16/2020 at 10:01 AM by telephone and verified that I was speaking with the correct person using 2 identifiers.  The patient was at home.  I discussed the limitations, risk, security and privacy concerns of performing an evaluation and management service by videoconferencing and the availability of in person appointments.  I also discussed with the patient that there may be a patient responsible charge related to this service.  The patient expressed understanding and agreed to proceed.   Chief Complaint: Helen Nguyen is a 64 y.o. female with thrombocytopenia who is seen for review of work-up and discussion regarding direction of therapy.  HPI: The patient was last seen in the hematology clinic on 12/31/2019 for new patient assessment. At that time, she felt fine. She had occasional palpitations and reflux. She bruised easily.  She denied any B symptoms.  He denied any new medications or herbal products. Exam revealed no adenopathy or hepatosplenomegaly.  Work-up revealed a hematocrit of 38.0, hemoglobin 12.9, platelets 128,000, WBC 7,900 (ANC 4200). Platelet count in a blue top tube was 123,200. Immature platelet fraction was 5.4% (normal).  CMP was normal.  H pylori IgG was 0.24 (negative).  Hepatitis C antibody was non reactive.  ANA was positive; Ds DNA antibody was 50 (0-9) and SSA (Ro) antibody was 2.2 (0-0.9).  During the interim, she notes general aches and pains. She has some left hip pian.    She has been on prednisone for 6 days.  She is scheduled for lumbar spine MRI.  She notes a history of spinal stenosis.  She denies any bruising or bleeding.   Past Medical History:  Diagnosis Date  . Anxiety   . Cerebral palsy (HCC)   .  Concussion 1976  . Depression   . 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 Gallbladder disease 2008  . Headache   . History of migraine headaches   . Hypercholesterolemia   . Hypothyroidism   . Idiopathic thrombocytopenic purpura (ITP) (HCC)   . Osteoporosis   . Peptic ulcer disease 2008  . Thyroid disease   . TMJ arthritis     Past Surgical History:  Procedure Laterality Date  . CARPAL TUNNEL RELEASE Left 1984  . CATARACT EXTRACTION W/ INTRAOCULAR LENS IMPLANT Right 12/2011   Dr. Inez Pilgrim ([facility])  . CESAREAN SECTION    . CHOLECYSTECTOMY  2008  . HEMORROIDECTOMY    . KYPHOPLASTY N/A 04/28/2017   Procedure: Nicki Reaper;  Surgeon: Kennedy Bucker, MD;  Location: [facility] ORS;  Service: Orthopedics;  Laterality: N/A;  . TMJ ARTHROPLASTY  1984, 2005, 2006   then removal of prosthesis  . TUBAL LIGATION      Family History  Problem Relation Age of Onset  . Heart disease Mother   . Cataracts Mother   . Glaucoma Mother   . Heart disease Father   . Ovarian cancer Sister   . Lung cancer Brother     Social History:  reports that she has never smoked. She has never used smokeless tobacco. She reports current alcohol use. She reports that she does not use drugs. She denies any tobacco use. She drinks wine 3x per month. She denies exposure to radiation or toxins. She lives with her husband, Helen Nguyen, in [place], [place]. She used to  live in [place]. 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  She lives in [place].  The patient is alone today.  Participants in the patient's visit and their role in the encounter included the patient, Helen Nguyen, Charity fundraiser, today.  The intake visit was provided by Helen Panda, RN.  Allergies:  Allergies  Allergen Reactions  . Codeine Nausea And Vomiting    Current Medications: Current Outpatient Medications   Medication Sig Dispense Refill  . levothyroxine (SYNTHROID, LEVOTHROID) 125 MCG tablet Take 125 mcg by mouth daily before breakfast.    . naproxen sodium (ALEVE) 220 MG tablet Take 440 mg by mouth 2 (two) times daily as needed.    . predniSONE (DELTASONE) 20 MG tablet Take 2 tablets (40 mg total) by mouth daily. (Patient not taking: Reported on 12/31/2019) 10 tablet 0  . propranolol (INDERAL) 20 MG tablet Take 20 mg by mouth 2 (two) times daily.    . sertraline (ZOLOFT) 100 MG tablet Take 100 mg by mouth daily.     . traMADol (ULTRAM) 50 MG tablet Take 50 mg by mouth every 6 (six) hours as needed for moderate pain.      No current facility-administered medications for this visit.    Review of Systems  Constitutional: Negative.  Negative for chills, diaphoresis, fever, malaise/fatigue and weight loss.  HENT: Negative.  Negative for congestion, ear discharge, ear pain, hearing loss, nosebleeds, sinus pain, sore throat and tinnitus.   Eyes: Negative.  Negative for blurred vision, double vision, photophobia and pain.  Respiratory: Negative.  Negative for cough, hemoptysis, sputum production and shortness of breath.   Cardiovascular: Negative.  Negative for chest pain, palpitations and leg swelling.  Gastrointestinal: Positive for heartburn. Negative for abdominal pain, blood in stool, constipation, diarrhea, melena, nausea and vomiting.  Genitourinary: Negative.  Negative for dysuria, frequency, hematuria and urgency.  Musculoskeletal: Positive for joint pain (left hip pain). Negative for back pain, myalgias and neck pain.       Aches and pains.  Osteoporosis. Spinal stenosis.  Skin: Negative.  Negative for itching and rash.  Neurological: Negative.  Negative for dizziness, tingling, sensory change, speech change, focal weakness, weakness and headaches.  Endo/Heme/Allergies: Bruises/bleeds easily.  Psychiatric/Behavioral: Negative for depression and memory loss. The patient is not  nervous/anxious and does not have insomnia.   All other systems reviewed and are negative.  Performance status (ECOG): 1  Vitals There were no vitals taken for this visit.   Physical Exam    No visits with results within 3 Day(s) from this visit.  Latest known visit with results is:  Admission on 04/28/2017, Discharged on 04/28/2017  Component Date Value Ref Range Status  . SURGICAL PATHOLOGY 04/28/2017    Final                   Value:Surgical Pathology CASE: [id number] PATIENT: Ailsa Cocker Surgical Pathology Report  SPECIMEN SUBMITTED: A. L2 Bone biopsy  CLINICAL HISTORY: None provided  PRE-OPERATIVE DIAGNOSIS: Closed compression fracture of L2  POST-OPERATIVE DIAGNOSIS: Same as pre op  DIAGNOSIS: A. BONE, L2; BIOPSY: - BONE AND MARROW STROMA WITH HEMORRHAGE AND REACTIVE CHANGES CONSISTENT WITH PROVIDED HISTORY OF FRACTURE. - TRILINEAGE HEMATOPOIESIS.  GROSS DESCRIPTION:  A. Labeled: L2 kypho bone biopsy  Tissue fragment(s): multiple  Size: aggregate, 1.8 x 1.3 x 0.1 cm  Description: in formalin, red to tan boney  and soft material  Entirely following decalcification submitted in one cassette(s).  Final Diagnosis performed by Elijah Birk, MD.  Electronically signed 04/29/2017 12:52:21PM  The electronic signature indicates that the named Attending Pathologist has evaluated the specimen  Technical component performed at [facility], [address] [place], [place] [address] Lab: [phone] Dir: Jolene Schimke, MD, MMM  Professional component performed at [facility], [facility], [address] [place], [place], [place] [address] Lab: [phone] Dir: Georgiann Cocker. Oneita Kras, MD    Assessment:  TAQUANNA BORRAS is a 64 y.o. female with chronic immune mediated thrombocytopenic purpura (ITP).  She was initially diagnosed in 19 while living in [place].  She has requred treatment on 2 occasions during pregnancy.  In  1994, during her first pregnancy, she was treated with prednisone and IVIG.  During her second pregnancy in 1999, she was treated with prednisone alone. Baseline platelet count is 120,000.  Bone marrow aspirate and biopsy was negative (by report).  Prior evaluation was negative for: HIV, hepatitis C.  She is hepatitis B immune.  Abdomen and pelvis CT in 2009 raised a concern for splenomegaly multiple granulomas in the spleen felt c/w prior granulomatous disease.  CBC on 12/17/2019 revealed a hematocrit 36.8, hemoglobin 12.4, platelets 96,000, WBC 6,300.  TSH was normal on 11/19/2019.  Work-up on 12/31/2019 revealed a hematocrit of 38.0, hemoglobin 12.9, platelets 128,000, WBC 7,900 (ANC 4200). Platelet count in a blue top tube was 123,200. Immature platelet fraction was 5.4% (normal).  CMP was normal.  H pylori IgG was 0.24 (negative).  Hepatitis C antibody was non reactive.  ANA was positive; Ds DNA antibody was 50 (0-9) and SSA (Ro) antibody was 2.2 (0-0.9).  Symptomatically, she notes aches and pains.  She denies any bruising or bleeding.  Plan: 1.   Review work-up 2.   Chronic immune mediated thrombocytopenic purpura (ITP)  Platelet count 128,000 on 12/31/2019.  No evidence of pseudothrombocytopenia.  Immature platelet fraction surprisingly normal.  Discuss + ANA   Referral to rheumatology.  Abdominal ultrasound to r/o splenomegaly.  Plan for treatment if platelets < 30,000.   Consider steroids, IVIG, Rituxan, Nplate, Promacta.  Continue to monitor. 3.   Rheumatology consult. 4.   Abdominal ultrasound. 5.   RTC after above for MD assessment and review of imaging and consult.  I discussed the assessment and treatment plan with the patient.  The patient was provided an opportunity to ask questions and all were answered.  The patient agreed with the plan and demonstrated an understanding of the instructions.  The patient was advised to call back if the symptoms worsen or if the condition  fails to improve as anticipated.  I provided 13 minutes (1:15 PM - 1:27 PM) of non face-to-face telephone visit time during this this encounter and > 50% was spent counseling as documented under my assessment and plan.  I provided these services from the [employer] office.   Tyjah Hai C. Merlene Pulling, MD, PhD    01/16/2020, 5:00 PM

## 2020-01-08 ENCOUNTER — Inpatient Hospital Stay: Payer: Federal, State, Local not specified - PPO | Admitting: Hematology and Oncology

## 2020-01-16 ENCOUNTER — Telehealth: Payer: Self-pay | Admitting: Hematology and Oncology

## 2020-01-16 ENCOUNTER — Inpatient Hospital Stay (HOSPITAL_BASED_OUTPATIENT_CLINIC_OR_DEPARTMENT_OTHER): Payer: Federal, State, Local not specified - PPO | Admitting: Hematology and Oncology

## 2020-01-16 ENCOUNTER — Other Ambulatory Visit: Payer: Self-pay

## 2020-01-16 ENCOUNTER — Telehealth: Payer: Self-pay

## 2020-01-16 ENCOUNTER — Encounter: Payer: Self-pay | Admitting: Hematology and Oncology

## 2020-01-16 ENCOUNTER — Ambulatory Visit
Admission: RE | Admit: 2020-01-16 | Discharge: 2020-01-16 | Disposition: A | Discharge status: Home / Self Care | Payer: Federal, State, Local not specified - PPO | Source: Ambulatory Visit | Attending: Orthopedic Surgery | Admitting: Orthopedic Surgery

## 2020-01-16 DIAGNOSIS — D693 Immune thrombocytopenic purpura: Secondary | ICD-10-CM

## 2020-01-16 DIAGNOSIS — M4807 Spinal stenosis, lumbosacral region: Secondary | ICD-10-CM | POA: Diagnosis not present

## 2020-01-16 DIAGNOSIS — R768 Other specified abnormal immunological findings in serum: Secondary | ICD-10-CM | POA: Diagnosis not present

## 2020-01-16 DIAGNOSIS — E039 Hypothyroidism, unspecified: Secondary | ICD-10-CM | POA: Insufficient documentation

## 2020-01-16 DIAGNOSIS — M26649 Arthritis of unspecified temporomandibular joint: Secondary | ICD-10-CM | POA: Insufficient documentation

## 2020-01-16 DIAGNOSIS — Z87898 Personal history of other specified conditions: Secondary | ICD-10-CM

## 2020-01-16 DIAGNOSIS — F32A Depression, unspecified: Secondary | ICD-10-CM | POA: Insufficient documentation

## 2020-01-16 NOTE — Telephone Encounter (Signed)
Faxed new patient information to Rheumatology office.

## 2020-01-29 ENCOUNTER — Ambulatory Visit
Admission: RE | Admit: 2020-01-29 | Discharge: 2020-01-29 | Disposition: A | Discharge status: Home / Self Care | Payer: Federal, State, Local not specified - PPO | Source: Ambulatory Visit | Attending: Hematology and Oncology | Admitting: Hematology and Oncology

## 2020-01-29 ENCOUNTER — Other Ambulatory Visit: Payer: Self-pay

## 2020-01-29 DIAGNOSIS — D693 Immune thrombocytopenic purpura: Secondary | ICD-10-CM | POA: Insufficient documentation

## 2020-01-29 DIAGNOSIS — Z87898 Personal history of other specified conditions: Secondary | ICD-10-CM | POA: Diagnosis present

## 2020-01-29 NOTE — Progress Notes (Signed)
[facility]  [address] [place], [place] [address] Phone: [phone]  Fax: [phone]   Clinic Day:  01/30/2020  Referring physician: Sharyne Peach, MD  Chief Complaint: Helen Nguyen is a 65 y.o. female with thrombocytopenia who is seen for review of interval abdominal ultrasound and rheumatology consult.  HPI: The patient was last seen in the hematology clinic on 01/16/2020 via telemedicine. At that time, she noted aches and pains.  Thrombocytopenia work-up was reviewed.  IPF was normal.  ANA was +.  We discussed referral to rheumatology.  An abdominal ultrasound was ordered to r/o splenomegaly.  Abdominal ultrasound on 01/29/2020 revealed a normal size spleen (10.3 cm; volume 247 ml) with scattered calcified granulomas.  There was hepatic steatosis and no focal hepatic lesion.  There were post cholecystectomy sequela.  During the interim, she has been "ok". She has several bruises on her arms but notes that she always bruises easily. She does not feel like this bruising is any different than usual. She is having a lot of hip and left leg pain which she was told is due to spinal stenosis. She denies any other new complaints.   Past Medical History:  Diagnosis Date  . Anxiety   . Cerebral palsy ([place])   . Concussion 1976  . Depression   . 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 Gallbladder disease 2008  . Headache   . History of migraine headaches   . Hypercholesterolemia   . Hypothyroidism   . Idiopathic thrombocytopenic purpura (ITP) (HCC)   . Osteoporosis   . Peptic ulcer disease 2008  . Thyroid disease   . TMJ arthritis     Past Surgical History:  Procedure Laterality Date  . CARPAL TUNNEL RELEASE Left 1984  . CATARACT EXTRACTION W/ INTRAOCULAR LENS IMPLANT Right 12/2011   Dr. Wallace Going ([facility] Lens)  . CESAREAN SECTION    . CHOLECYSTECTOMY  2008  . HEMORROIDECTOMY    . KYPHOPLASTY N/A 04/28/2017   Procedure:  Hewitt Shorts;  Surgeon: Hessie Knows, MD;  Location: [facility] ORS;  Service: Orthopedics;  Laterality: N/A;  . TMJ ARTHROPLASTY  1984, 2005, 2006   then removal of prosthesis  . TUBAL LIGATION      Family History  Problem Relation Age of Onset  . Heart disease Mother   . Cataracts Mother   . Glaucoma Mother   . Heart disease Father   . Ovarian cancer Sister   . Lung cancer Brother     Social History:  reports that she has never smoked. She has never used smokeless tobacco. She reports current alcohol use. She reports that she does not use drugs. She denies any tobacco use. She drinks wine 3x per month. She denies exposure to radiation or toxins. She lives with her husband, Pilar Plate, in [place], [place]. She used to live in [place]. 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  She lives in [place].  The patient is alone today.  Allergies:  Allergies  Allergen Reactions  . Codeine Nausea And Vomiting    Current Medications: Current Outpatient Medications  Medication Sig Dispense Refill  . acetaminophen (TYLENOL) 325 MG tablet takes 1-2 tabs by mouth daily as needed    . denosumab (PROLIA) 60 MG/ML SOSY injection Inject into the skin.    Marland Kitchen ergocalciferol (VITAMIN D2) 1.25 MG (50000 UT) capsule Take 1  capsule by mouth once a week.    . levothyroxine (SYNTHROID, LEVOTHROID) 125 MCG tablet Take 125 mcg by mouth daily before breakfast.    . propranolol (INDERAL) 20 MG tablet Take 20 mg by mouth 2 (two) times daily.    . sertraline (ZOLOFT) 100 MG tablet Take 100 mg by mouth daily.     Marland Kitchen. tiZANidine (ZANAFLEX) 2 MG tablet Take by mouth.    . traMADol (ULTRAM) 50 MG tablet Take 50 mg by mouth every 6 (six) hours as needed for moderate pain.     . naproxen sodium (ALEVE) 220 MG tablet Take 440 mg by mouth 2 (two) times daily as needed. (Patient not taking: Reported  on 01/30/2020)    . predniSONE (DELTASONE) 20 MG tablet Take 2 tablets (40 mg total) by mouth daily. (Patient not taking: No sig reported) 10 tablet 0   No current facility-administered medications for this visit.    Review of Systems  Constitutional: Positive for weight loss (2 lbs over the past month). Negative for chills, diaphoresis, fever and malaise/fatigue.  HENT: Negative for congestion, ear discharge, ear pain, hearing loss, nosebleeds, sinus pain, sore throat and tinnitus.   Eyes: Negative for blurred vision.  Respiratory: Negative for cough, hemoptysis, sputum production and shortness of breath.   Cardiovascular: Positive for palpitations (occasional). Negative for chest pain and leg swelling.  Gastrointestinal: Positive for heartburn. Negative for abdominal pain, blood in stool, constipation, diarrhea, melena, nausea and vomiting.  Genitourinary: Negative for dysuria, frequency, hematuria and urgency.  Musculoskeletal: Negative for back pain, joint pain, myalgias and neck pain.       Osteoporosis. Spinal stenosis. Osteoarthritis of the jaw.  Skin: Negative for itching and rash.  Neurological: Negative for dizziness, tingling, sensory change, weakness and headaches.  Endo/Heme/Allergies: Bruises/bleeds easily.  Psychiatric/Behavioral: Negative for depression and memory loss. The patient is not nervous/anxious and does not have insomnia.   All other systems reviewed and are negative.  Performance status (ECOG): 1  Vitals Blood pressure (!) 122/95, pulse 67, temperature 98.6 F (37 C), temperature source Tympanic, resp. rate 16, weight 161 lb 9.6 oz (73.3 kg), SpO2 97 %.   Physical Exam Vitals and nursing note reviewed.  Constitutional:      General: She is not in acute distress.    Appearance: She is not diaphoretic.  HENT:     Head:     Comments: Curly short brown hair. Eyes:     General: No scleral icterus.    Conjunctiva/sclera: Conjunctivae normal.     Comments:  Glasses.  Neurological:     Mental Status: She is alert and oriented to person, place, and time.  Psychiatric:        Behavior: Behavior normal.        Thought Content: Thought content normal.        Judgment: Judgment normal.    No visits with results within 3 Day(s) from this visit.  Latest known visit with results is:  Clinical Support on 12/31/2019  Component Date Value Ref Range Status  . HCV Ab 12/31/2019 NON REACTIVE  NON REACTIVE Final   Comment: (NOTE) Nonreactive HCV antibody screen is consistent with no HCV infections,  unless recent infection is suspected or other evidence exists to indicate HCV infection.  Performed at [facility], [address] [place], [place] [address]   . Platelet CT in [place] 12/31/2019 123.2   Final   Performed at [facility], [address] [place],  [place] [address]  . H Pylori IgG 12/31/2019 0.24  0.00 - 0.79 Index Value Final   Comment: (NOTE)                             Negative           <0.80                             Equivocal    0.80 - 0.89                             Positive           >0.89 Performed At: [facility] [address] [place], [place] [id number] Jolene Schimke MD PZ:[id number]   . Anti Nuclear Antibody (ANA) 12/31/2019 Positive* Negative Final   Comment: (NOTE) Performed At: [facility] [address] [place], [place] [id number] Jolene Schimke MD RX:[id number]   . Sodium 12/31/2019 134* 135 - 145 mmol/L Final  . Potassium 12/31/2019 4.2  3.5 - 5.1 mmol/L Final  . Chloride 12/31/2019 97* 98 - 111 mmol/L Final  . CO2 12/31/2019 25  22 - 32 mmol/L Final  . Glucose, Bld 12/31/2019 109* 70 - 99 mg/dL Final   Glucose reference range applies only to samples taken after fasting for at least 8 hours.  . BUN 12/31/2019 22  8 - 23 mg/dL Final  . Creatinine, Ser 12/31/2019 0.76  0.44 - 1.00 mg/dL Final  . Calcium 50/93/2671 9.7  8.9 - 10.3 mg/dL Final  . Total Protein  12/31/2019 7.4  6.5 - 8.1 g/dL Final  . Albumin 24/58/0998 4.3  3.5 - 5.0 g/dL Final  . AST 33/82/5053 22  15 - 41 U/L Final  . ALT 12/31/2019 20  0 - 44 U/L Final  . Alkaline Phosphatase 12/31/2019 51  38 - 126 U/L Final  . Total Bilirubin 12/31/2019 0.5  0.3 - 1.2 mg/dL Final  . GFR, Estimated 12/31/2019 >60  >60 mL/min Final   Comment: (NOTE) Calculated using the CKD-EPI Creatinine Equation (2021)   . Anion gap 12/31/2019 12  5 - 15 Final   Performed at [facility], [address], [place], [place] [address]  . Immature Platelet Fraction 12/31/2019 5.4  1.2 - 8.6 % Final   Performed at [facility], [address], [place], [place] [address]  . WBC 12/31/2019 7.9  4.0 - 10.5 K/uL Final  . RBC 12/31/2019 4.38  3.87 - 5.11 MIL/uL Final  . Hemoglobin 12/31/2019 12.9  12.0 - 15.0 g/dL Final  . HCT 90/24/0973 38.0  36.0 - 46.0 % Final  . MCV 12/31/2019 86.8  80.0 - 100.0 fL Final  . MCH 12/31/2019 29.5  26.0 - 34.0 pg Final  . MCHC 12/31/2019 33.9  30.0 - 36.0 g/dL Final  . RDW 53/29/9242 13.1  11.5 - 15.5 % Final  . Platelets 12/31/2019 128* 150 - 400 K/uL Final  . nRBC 12/31/2019 0.0  0.0 - 0.2 % Final  . Neutrophils Relative % 12/31/2019 52  % Final  . Neutro Abs 12/31/2019 4.2  1.7 - 7.7 K/uL Final  . Lymphocytes Relative 12/31/2019 37  % Final  . Lymphs Abs 12/31/2019 2.9  0.7 - 4.0 K/uL Final  . Monocytes Relative 12/31/2019 7  % Final  . Monocytes Absolute 12/31/2019 0.6  0.1 -  1.0 K/uL Final  . Eosinophils Relative 12/31/2019 2  % Final  . Eosinophils Absolute 12/31/2019 0.2  0.0 - 0.5 K/uL Final  . Basophils Relative 12/31/2019 1  % Final  . Basophils Absolute 12/31/2019 0.0  0.0 - 0.1 K/uL Final  . Immature Granulocytes 12/31/2019 1  % Final  . Abs Immature Granulocytes 12/31/2019 0.05  0.00 - 0.07 K/uL Final   Performed at [facility], [address] [place], [place] [address]  . ds DNA Ab 12/31/2019 50* 0 - 9 IU/mL Final    Comment: (NOTE)                                   Negative      <5                                   Equivocal  5 - 9                                   Positive      >9   . Ribonucleic Protein 12/31/2019 0.3  0.0 - 0.9 AI Final  . ENA SM Ab Ser-aCnc 12/31/2019 <0.2  0.0 - 0.9 AI Final  . SSA (Ro) (ENA) Antibody, IgG 12/31/2019 2.2* 0.0 - 0.9 AI Final  . SSB (La) (ENA) Antibody, IgG 12/31/2019 <0.2  0.0 - 0.9 AI Final  . See below: 12/31/2019 Comment   Final   Comment: (NOTE) Autoantibody                       Disease Association ------------------------------------------------------------                        Condition                  Frequency ---------------------   ------------------------   --------- Antinuclear Antibody,    SLE, mixed connective Direct (ANA-D)           tissue diseases ---------------------   ------------------------   --------- dsDNA                    SLE                        40 - 60% ---------------------   ------------------------   --------- Chromatin                Drug induced SLE                90%                         SLE                        48 - 97% ---------------------   ------------------------   --------- SSA (Ro)                 SLE                        25 - 35%  Sjogren's Syndrome         40 - 70%                         Neonatal Lupus                 100% ---------------------   ------------------------   --------- SSB (La)                 SLE                                                       10%                         Sjogren's Syndrome              30% ---------------------   -----------------------    --------- Sm (anti-Smith)          SLE                        15 - 30% ---------------------   -----------------------    --------- RNP                      Mixed Connective Tissue                         Disease                         95% (U1 nRNP,                SLE                        30 -  50% anti-ribonucleoprotein)  Polymyositis and/or                         Dermatomyositis                 20% ---------------------   ------------------------   --------- Scl-70 (antiDNA          Scleroderma (diffuse)      20 - 35% topoisomerase)           Crest                           13% ---------------------   ------------------------   --------- Jo-1                     Polymyositis and/or                         Dermatomyositis            20 - 40% ---------------------   ------------------------   --------- Centromere B             Scleroderma -                           Crest  variant                         80% Performed At: [facility] [address] [place], [place] [id number] Jolene SchimkeNagendra Sanjai MD NW:[id number]:[phone]     Assessment:  Helen Nguyen is a 65 y.o. female with chromic immune mediated thrombocytopenic purpura (ITP).  She was initially diagnosed in [address]1984 while living in [place].  She has requred treatment on 2 occasions during pregnancy.  In 1994, during her first pregnancy, she was treated with prednisone and IVIG.  During her second pregnancy in 1999, she was treated with prednisone alone. Baseline platelet count is 120,000.  Bone marrow aspirate and biopsy was negative (by report).  Prior evaluation was negative for: HIV, hepatitis C.  She is hepatitis B immune.  Abdomen and pelvis CT in 2009 raised a concern for splenomegaly multiple granulomas in the spleen felt c/w prior granulomatous disease.  CBC on 12/17/2019 revealed a hematocrit 36.8, hemoglobin 12.4, platelets   96,000, WBC 6,300.  TSH was normal on 11/19/2019.  Work-up on 12/31/2019 revealed a hematocrit of 38.0, hemoglobin 12.9, platelets 128,000, WBC 7,900 (ANC 4200). Platelet count in a blue top tube was 123,200. Immature platelet fraction was 5.4% (normal).  CMP was normal.  H pylori IgG was 0.24 (negative).  Hepatitis C antibody was non reactive.  ANA was positive; Ds DNA  antibody was 50 (0-9) and SSA (Ro) antibody was 2.2 (0-0.9).  Abdominal ultrasound on 01/29/2020 revealed a normal size spleen (10.3 cm; volume 247 ml) with scattered calcified granulomas.  There was hepatic steatosis and no focal hepatic lesion.  There were post cholecystectomy sequela.  The patient received the Pfizer COVID-19 vaccine on 01/10/2020.  Symptomatically, she feels "ok".  She has several bruises on her arms; she always bruises easily.  Exam is stable.  Plan: 1.   Chronic immune mediated thrombocytopenic purpura (ITP)             Platelet count was 128,000 on 12/31/2019.             She has no evidence of of pseudothrombocytopenia.             Immature platelet fraction surprisingly normal.             She has a + ANA of unclear significance.                         She is scheduled to see rheumatology on 02/29/2020.             Abdominal ultrasound on 01/29/2020 revealed a normal spleen.             Review plan for treatment of presumed ITP if platelets < 30,000.                         Consider steroids, IVIG, Rituxan, Nplate, Promacta.             Patient to contact clinic if any excess bruising or bleeding as may indicate a drop in platelet count. 2.   RTC on 01/01/2021 as previously scheduled.  I discussed the assessment and treatment plan with the patient.  The patient was provided an opportunity to ask questions and all were answered.  The patient agreed with the plan and demonstrated an understanding of the instructions.  The patient was advised to call back if the symptoms worsen or if  the condition fails to improve as anticipated.   Mischa Pollard C. Merlene Pulling, MD, PhD    01/30/2020, 10:28 AM  I, Danella Penton Tufford, am acting as Neurosurgeon for [employer]. Merlene Pulling, MD, PhD.  I, Ashley Montminy C. Merlene Pulling, MD, have reviewed the above documentation for accuracy and completeness, and I agree with the above.

## 2020-01-30 ENCOUNTER — Inpatient Hospital Stay
Payer: Federal, State, Local not specified - PPO | Attending: Hematology and Oncology | Admitting: Hematology and Oncology

## 2020-01-30 ENCOUNTER — Encounter: Payer: Self-pay | Admitting: Hematology and Oncology

## 2020-01-30 VITALS — BP 122/95 | HR 67 | Temp 98.6°F | Resp 16 | Wt 161.6 lb

## 2020-01-30 DIAGNOSIS — E039 Hypothyroidism, unspecified: Secondary | ICD-10-CM | POA: Diagnosis not present

## 2020-01-30 DIAGNOSIS — D693 Immune thrombocytopenic purpura: Secondary | ICD-10-CM | POA: Diagnosis not present

## 2020-01-30 DIAGNOSIS — R768 Other specified abnormal immunological findings in serum: Secondary | ICD-10-CM

## 2020-01-30 NOTE — Progress Notes (Signed)
Patient here for oncology follow-up appointment, expresses  concerns of hip pain and bruises.

## 2020-12-25 ENCOUNTER — Other Ambulatory Visit: Payer: Self-pay | Admitting: *Deleted

## 2020-12-25 DIAGNOSIS — D693 Immune thrombocytopenic purpura: Secondary | ICD-10-CM

## 2021-01-01 ENCOUNTER — Inpatient Hospital Stay: Payer: Federal, State, Local not specified - PPO | Admitting: Oncology

## 2021-01-01 ENCOUNTER — Inpatient Hospital Stay: Payer: Federal, State, Local not specified - PPO | Attending: Oncology

## 2021-01-01 ENCOUNTER — Ambulatory Visit: Payer: Federal, State, Local not specified - PPO | Admitting: Oncology

## 2021-01-01 ENCOUNTER — Other Ambulatory Visit: Payer: Federal, State, Local not specified - PPO

## 2022-01-28 ENCOUNTER — Ambulatory Visit
Admission: RE | Admit: 2022-01-28 | Discharge: 2022-01-28 | Disposition: A | Discharge status: Home / Self Care | Payer: PRIVATE HEALTH INSURANCE | Source: Ambulatory Visit | Attending: Physician Assistant | Admitting: Physician Assistant

## 2022-01-28 ENCOUNTER — Other Ambulatory Visit: Payer: Self-pay | Admitting: Endocrinology

## 2022-01-28 ENCOUNTER — Ambulatory Visit
Admission: RE | Admit: 2022-01-28 | Discharge: 2022-01-28 | Disposition: A | Discharge status: Home / Self Care | Payer: PRIVATE HEALTH INSURANCE | Source: Ambulatory Visit | Attending: Endocrinology | Admitting: Endocrinology

## 2022-01-28 DIAGNOSIS — M7989 Other specified soft tissue disorders: Secondary | ICD-10-CM | POA: Diagnosis present

## 2022-01-28 DIAGNOSIS — M79642 Pain in left hand: Secondary | ICD-10-CM

## 2022-08-26 ENCOUNTER — Other Ambulatory Visit: Payer: Self-pay | Admitting: Family Medicine

## 2022-08-26 DIAGNOSIS — Z9049 Acquired absence of other specified parts of digestive tract: Secondary | ICD-10-CM

## 2022-08-26 DIAGNOSIS — R1011 Right upper quadrant pain: Secondary | ICD-10-CM

## 2022-08-27 ENCOUNTER — Ambulatory Visit
Admission: RE | Admit: 2022-08-27 | Discharge: 2022-08-27 | Disposition: A | Discharge status: Home / Self Care | Payer: Medicare Other | Source: Ambulatory Visit | Attending: Family Medicine | Admitting: Family Medicine

## 2022-08-27 DIAGNOSIS — Z9049 Acquired absence of other specified parts of digestive tract: Secondary | ICD-10-CM | POA: Insufficient documentation

## 2022-08-27 DIAGNOSIS — R1011 Right upper quadrant pain: Secondary | ICD-10-CM | POA: Insufficient documentation
